# Patient Record
Sex: Female | Born: 1985
Health system: Southern US, Community
[De-identification: ages and names within clinical notes are randomized; demographics above are authoritative.]

## PROBLEM LIST (undated history)

## (undated) DIAGNOSIS — F32A Depression, unspecified: Secondary | ICD-10-CM

## (undated) HISTORY — PX: WISDOM TOOTH EXTRACTION: SHX21

## (undated) HISTORY — DX: Depression, unspecified: F32.A

---

## 2019-11-07 DIAGNOSIS — F411 Generalized anxiety disorder: Secondary | ICD-10-CM | POA: Diagnosis not present

## 2019-11-14 DIAGNOSIS — F411 Generalized anxiety disorder: Secondary | ICD-10-CM | POA: Diagnosis not present

## 2019-11-19 DIAGNOSIS — F411 Generalized anxiety disorder: Secondary | ICD-10-CM | POA: Diagnosis not present

## 2020-01-01 DIAGNOSIS — Z6823 Body mass index (BMI) 23.0-23.9, adult: Secondary | ICD-10-CM | POA: Diagnosis not present

## 2020-01-01 DIAGNOSIS — M26629 Arthralgia of temporomandibular joint, unspecified side: Secondary | ICD-10-CM | POA: Diagnosis not present

## 2020-01-02 DIAGNOSIS — M26629 Arthralgia of temporomandibular joint, unspecified side: Secondary | ICD-10-CM | POA: Diagnosis not present

## 2020-01-11 DIAGNOSIS — Z23 Encounter for immunization: Secondary | ICD-10-CM | POA: Diagnosis not present

## 2020-01-24 DIAGNOSIS — F411 Generalized anxiety disorder: Secondary | ICD-10-CM | POA: Diagnosis not present

## 2020-02-01 DIAGNOSIS — Z23 Encounter for immunization: Secondary | ICD-10-CM | POA: Diagnosis not present

## 2020-03-25 DIAGNOSIS — N943 Premenstrual tension syndrome: Secondary | ICD-10-CM | POA: Diagnosis not present

## 2020-03-25 DIAGNOSIS — R5383 Other fatigue: Secondary | ICD-10-CM | POA: Diagnosis not present

## 2020-05-21 DIAGNOSIS — Z20822 Contact with and (suspected) exposure to covid-19: Secondary | ICD-10-CM | POA: Diagnosis not present

## 2020-05-30 DIAGNOSIS — Z20822 Contact with and (suspected) exposure to covid-19: Secondary | ICD-10-CM | POA: Diagnosis not present

## 2020-06-02 DIAGNOSIS — Z20828 Contact with and (suspected) exposure to other viral communicable diseases: Secondary | ICD-10-CM | POA: Diagnosis not present

## 2020-06-09 DIAGNOSIS — Z113 Encounter for screening for infections with a predominantly sexual mode of transmission: Secondary | ICD-10-CM | POA: Diagnosis not present

## 2020-06-09 DIAGNOSIS — N943 Premenstrual tension syndrome: Secondary | ICD-10-CM | POA: Diagnosis not present

## 2020-06-09 DIAGNOSIS — Z131 Encounter for screening for diabetes mellitus: Secondary | ICD-10-CM | POA: Diagnosis not present

## 2020-06-09 DIAGNOSIS — E161 Other hypoglycemia: Secondary | ICD-10-CM | POA: Diagnosis not present

## 2020-06-09 DIAGNOSIS — Z01419 Encounter for gynecological examination (general) (routine) without abnormal findings: Secondary | ICD-10-CM | POA: Diagnosis not present

## 2020-06-09 DIAGNOSIS — Z124 Encounter for screening for malignant neoplasm of cervix: Secondary | ICD-10-CM | POA: Diagnosis not present

## 2020-06-09 DIAGNOSIS — Z13220 Encounter for screening for lipoid disorders: Secondary | ICD-10-CM | POA: Diagnosis not present

## 2020-06-09 DIAGNOSIS — Z130 Encounter for screening for diseases of the blood and blood-forming organs and certain disorders involving the immune mechanism: Secondary | ICD-10-CM | POA: Diagnosis not present

## 2020-06-09 DIAGNOSIS — Z1329 Encounter for screening for other suspected endocrine disorder: Secondary | ICD-10-CM | POA: Diagnosis not present

## 2020-06-09 LAB — HM PAP SMEAR

## 2020-08-21 DIAGNOSIS — Z713 Dietary counseling and surveillance: Secondary | ICD-10-CM | POA: Diagnosis not present

## 2020-08-26 DIAGNOSIS — Z713 Dietary counseling and surveillance: Secondary | ICD-10-CM | POA: Diagnosis not present

## 2020-09-11 DIAGNOSIS — Z713 Dietary counseling and surveillance: Secondary | ICD-10-CM | POA: Diagnosis not present

## 2020-10-01 DIAGNOSIS — Z713 Dietary counseling and surveillance: Secondary | ICD-10-CM | POA: Diagnosis not present

## 2020-10-30 DIAGNOSIS — Z713 Dietary counseling and surveillance: Secondary | ICD-10-CM | POA: Diagnosis not present

## 2020-11-23 DIAGNOSIS — Z713 Dietary counseling and surveillance: Secondary | ICD-10-CM | POA: Diagnosis not present

## 2020-12-15 DIAGNOSIS — F411 Generalized anxiety disorder: Secondary | ICD-10-CM | POA: Diagnosis not present

## 2020-12-18 DIAGNOSIS — L237 Allergic contact dermatitis due to plants, except food: Secondary | ICD-10-CM | POA: Diagnosis not present

## 2020-12-18 DIAGNOSIS — Z6826 Body mass index (BMI) 26.0-26.9, adult: Secondary | ICD-10-CM | POA: Diagnosis not present

## 2020-12-28 DIAGNOSIS — F411 Generalized anxiety disorder: Secondary | ICD-10-CM | POA: Diagnosis not present

## 2021-01-08 DIAGNOSIS — F411 Generalized anxiety disorder: Secondary | ICD-10-CM | POA: Diagnosis not present

## 2021-01-28 DIAGNOSIS — F411 Generalized anxiety disorder: Secondary | ICD-10-CM | POA: Diagnosis not present

## 2021-02-05 DIAGNOSIS — F411 Generalized anxiety disorder: Secondary | ICD-10-CM | POA: Diagnosis not present

## 2021-02-19 DIAGNOSIS — F411 Generalized anxiety disorder: Secondary | ICD-10-CM | POA: Diagnosis not present

## 2021-03-05 DIAGNOSIS — F411 Generalized anxiety disorder: Secondary | ICD-10-CM | POA: Diagnosis not present

## 2021-04-16 DIAGNOSIS — F411 Generalized anxiety disorder: Secondary | ICD-10-CM | POA: Diagnosis not present

## 2021-05-25 DIAGNOSIS — M7521 Bicipital tendinitis, right shoulder: Secondary | ICD-10-CM | POA: Insufficient documentation

## 2021-05-25 DIAGNOSIS — F411 Generalized anxiety disorder: Secondary | ICD-10-CM | POA: Diagnosis not present

## 2021-05-25 DIAGNOSIS — M25511 Pain in right shoulder: Secondary | ICD-10-CM | POA: Insufficient documentation

## 2021-05-25 DIAGNOSIS — M7591 Shoulder lesion, unspecified, right shoulder: Secondary | ICD-10-CM | POA: Diagnosis not present

## 2021-05-28 DIAGNOSIS — Z20822 Contact with and (suspected) exposure to covid-19: Secondary | ICD-10-CM | POA: Diagnosis not present

## 2021-06-02 DIAGNOSIS — R140 Abdominal distension (gaseous): Secondary | ICD-10-CM | POA: Diagnosis not present

## 2021-06-07 DIAGNOSIS — M7591 Shoulder lesion, unspecified, right shoulder: Secondary | ICD-10-CM | POA: Diagnosis not present

## 2021-06-07 DIAGNOSIS — M25511 Pain in right shoulder: Secondary | ICD-10-CM | POA: Diagnosis not present

## 2021-06-09 DIAGNOSIS — N943 Premenstrual tension syndrome: Secondary | ICD-10-CM | POA: Diagnosis not present

## 2021-06-09 DIAGNOSIS — R140 Abdominal distension (gaseous): Secondary | ICD-10-CM | POA: Diagnosis not present

## 2021-06-09 DIAGNOSIS — Z6825 Body mass index (BMI) 25.0-25.9, adult: Secondary | ICD-10-CM | POA: Diagnosis not present

## 2021-06-09 DIAGNOSIS — Z113 Encounter for screening for infections with a predominantly sexual mode of transmission: Secondary | ICD-10-CM | POA: Diagnosis not present

## 2021-06-10 DIAGNOSIS — R140 Abdominal distension (gaseous): Secondary | ICD-10-CM | POA: Diagnosis not present

## 2021-06-15 DIAGNOSIS — F411 Generalized anxiety disorder: Secondary | ICD-10-CM | POA: Diagnosis not present

## 2021-06-22 DIAGNOSIS — F411 Generalized anxiety disorder: Secondary | ICD-10-CM | POA: Diagnosis not present

## 2021-06-29 ENCOUNTER — Encounter: Payer: Self-pay | Admitting: Internal Medicine

## 2021-06-29 ENCOUNTER — Ambulatory Visit (INDEPENDENT_AMBULATORY_CARE_PROVIDER_SITE_OTHER): Payer: BC Managed Care – PPO | Admitting: Internal Medicine

## 2021-06-29 ENCOUNTER — Other Ambulatory Visit: Payer: Self-pay

## 2021-06-29 VITALS — BP 123/85 | HR 85 | Temp 99.1°F | Ht 67.0 in | Wt 162.6 lb

## 2021-06-29 DIAGNOSIS — Z23 Encounter for immunization: Secondary | ICD-10-CM

## 2021-06-29 DIAGNOSIS — R519 Headache, unspecified: Secondary | ICD-10-CM | POA: Insufficient documentation

## 2021-06-29 DIAGNOSIS — F339 Major depressive disorder, recurrent, unspecified: Secondary | ICD-10-CM

## 2021-06-29 LAB — URINALYSIS, ROUTINE W REFLEX MICROSCOPIC
Bilirubin, UA: NEGATIVE
Ketones, UA: NEGATIVE
Leukocytes,UA: NEGATIVE
Nitrite, UA: NEGATIVE
Protein,UA: NEGATIVE
RBC, UA: NEGATIVE
Specific Gravity, UA: <1.005 — ABNORMAL LOW (ref 1.005–1.030)
Urobilinogen, Ur: 0.2 mg/dL (ref 0.2–1.0)
pH, UA: 6 (ref 5.0–7.5)
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
## 2021-06-30 LAB — COMPREHENSIVE METABOLIC PANEL
ALT: 26 IU/L (ref 0–32)
AST: 20 IU/L (ref 0–40)
Albumin/Globulin Ratio: 1.8 (ref 1.2–2.2)
Albumin: 4.8 g/dL (ref 3.8–4.8)
Alkaline Phosphatase: 63 IU/L (ref 44–121)
BUN/Creatinine Ratio: 18 (ref 9–23)
BUN: 14 mg/dL (ref 6–20)
Bilirubin Total: 0.4 mg/dL (ref 0.0–1.2)
CO2: 24 mmol/L (ref 20–29)
Calcium: 9.9 mg/dL (ref 8.7–10.2)
Chloride: 102 mmol/L (ref 96–106)
Creatinine, Ser: 0.78 mg/dL (ref 0.57–1.00)
Globulin, Total: 2.6 g/dL (ref 1.5–4.5)
Glucose: 100 mg/dL — ABNORMAL HIGH (ref 65–99)
Potassium: 3.9 mmol/L (ref 3.5–5.2)
Sodium: 142 mmol/L (ref 134–144)
Total Protein: 7.4 g/dL (ref 6.0–8.5)
eGFR: 100 mL/min/{1.73_m2} (ref >59)

## 2021-06-30 LAB — TSH: TSH: 1.03 u[IU]/mL (ref 0.450–4.500)

## 2021-07-06 DIAGNOSIS — F411 Generalized anxiety disorder: Secondary | ICD-10-CM | POA: Diagnosis not present

## 2021-07-22 ENCOUNTER — Telehealth: Payer: Self-pay | Admitting: Internal Medicine

## 2021-07-22 NOTE — Telephone Encounter (Signed)
Copied from CRM [id number]. Topic: General - Other >> Jul 22, 2021  1:33 PM Gwenlyn Fudge wrote: Reason for CRM: Pt called stating that she is on day 14 of Covid and is still testing positive for covid. Pt is concerned about her appt tomorrow and is requesting to know protocol. Pt states that she has no symptoms. Attempted to get answer from practice and was advised to leave CRM per [place]. Please advise.

## 2021-07-22 NOTE — Telephone Encounter (Signed)
Yes that is correct. 

## 2021-07-23 ENCOUNTER — Other Ambulatory Visit: Payer: Self-pay

## 2021-07-23 ENCOUNTER — Other Ambulatory Visit: Payer: BC Managed Care – PPO

## 2021-07-23 DIAGNOSIS — R519 Headache, unspecified: Secondary | ICD-10-CM

## 2021-07-23 DIAGNOSIS — Z13220 Encounter for screening for lipoid disorders: Secondary | ICD-10-CM | POA: Diagnosis not present

## 2021-07-23 DIAGNOSIS — F411 Generalized anxiety disorder: Secondary | ICD-10-CM | POA: Diagnosis not present

## 2021-07-23 NOTE — Addendum Note (Signed)
Addended by: Judd Gaudier on: 07/23/2021 08:38 AM   Modules accepted: Orders

## 2021-07-24 LAB — CBC WITH DIFFERENTIAL/PLATELET
Basophils Absolute: 0 10*3/uL (ref 0.0–0.2)
Basos: 1 %
EOS (ABSOLUTE): 0.1 10*3/uL (ref 0.0–0.4)
Eos: 2 %
Hematocrit: 43.9 % (ref 34.0–46.6)
Hemoglobin: 14.7 g/dL (ref 11.1–15.9)
Immature Grans (Abs): 0 10*3/uL (ref 0.0–0.1)
Immature Granulocytes: 0 %
Lymphocytes Absolute: 1.5 10*3/uL (ref 0.7–3.1)
Lymphs: 24 %
MCH: 30.5 pg (ref 26.6–33.0)
MCHC: 33.5 g/dL (ref 31.5–35.7)
MCV: 91 fL (ref 79–97)
Monocytes Absolute: 0.5 10*3/uL (ref 0.1–0.9)
Monocytes: 8 %
Neutrophils Absolute: 4.1 10*3/uL (ref 1.4–7.0)
Neutrophils: 65 %
Platelets: 324 10*3/uL (ref 150–450)
RBC: 4.82 x10E6/uL (ref 3.77–5.28)
RDW: 11.8 % (ref 11.7–15.4)
WBC: 6.2 10*3/uL (ref 3.4–10.8)

## 2021-07-24 LAB — LIPID PANEL
Chol/HDL Ratio: 2.8 ratio (ref 0.0–4.4)
Cholesterol, Total: 181 mg/dL (ref 100–199)
HDL: 64 mg/dL (ref >39)
LDL Chol Calc (NIH): 104 mg/dL — ABNORMAL HIGH (ref 0–99)
Triglycerides: 70 mg/dL (ref 0–149)
VLDL Cholesterol Cal: 13 mg/dL (ref 5–40)

## 2021-08-06 ENCOUNTER — Encounter: Payer: BC Managed Care – PPO | Admitting: Internal Medicine

## 2021-08-12 DIAGNOSIS — F411 Generalized anxiety disorder: Secondary | ICD-10-CM | POA: Diagnosis not present

## 2021-08-24 ENCOUNTER — Ambulatory Visit (INDEPENDENT_AMBULATORY_CARE_PROVIDER_SITE_OTHER): Payer: BC Managed Care – PPO | Admitting: Internal Medicine

## 2021-08-24 ENCOUNTER — Other Ambulatory Visit: Payer: Self-pay

## 2021-08-24 ENCOUNTER — Encounter: Payer: Self-pay | Admitting: Internal Medicine

## 2021-08-24 VITALS — BP 122/88 | HR 71 | Temp 98.5°F | Ht 67.01 in | Wt 165.4 lb

## 2021-08-24 DIAGNOSIS — Z77090 Contact with and (suspected) exposure to asbestos: Secondary | ICD-10-CM

## 2021-08-24 DIAGNOSIS — R109 Unspecified abdominal pain: Secondary | ICD-10-CM | POA: Diagnosis not present

## 2021-08-24 DIAGNOSIS — R81 Glycosuria: Secondary | ICD-10-CM

## 2021-08-24 DIAGNOSIS — R140 Abdominal distension (gaseous): Secondary | ICD-10-CM

## 2021-08-24 DIAGNOSIS — Z0000 Encounter for general adult medical examination without abnormal findings: Secondary | ICD-10-CM

## 2021-08-24 LAB — URINALYSIS, ROUTINE W REFLEX MICROSCOPIC
Bilirubin, UA: NEGATIVE
Glucose, UA: NEGATIVE
Ketones, UA: NEGATIVE
Leukocytes,UA: NEGATIVE
Nitrite, UA: NEGATIVE
Protein,UA: NEGATIVE
RBC, UA: NEGATIVE
Specific Gravity, UA: 1.01 (ref 1.005–1.030)
Urobilinogen, Ur: 0.2 mg/dL (ref 0.2–1.0)
pH, UA: 7 (ref 5.0–7.5)

## 2021-08-24 LAB — BAYER DCA HB A1C WAIVED: HB A1C (BAYER DCA - WAIVED): 4.8 % (ref 4.8–5.6)

## 2021-08-24 MED ORDER — FEXOFENADINE HCL 180 MG PO TABS: 180.0000 mg | ORAL_TABLET | Freq: Every day | ORAL | 1 refills | Status: DC
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
## 2021-08-27 DIAGNOSIS — F411 Generalized anxiety disorder: Secondary | ICD-10-CM | POA: Diagnosis not present

## 2021-09-14 ENCOUNTER — Encounter: Payer: Self-pay | Admitting: Nurse Practitioner

## 2021-09-14 ENCOUNTER — Telehealth (INDEPENDENT_AMBULATORY_CARE_PROVIDER_SITE_OTHER): Payer: BC Managed Care – PPO | Admitting: Nurse Practitioner

## 2021-09-14 DIAGNOSIS — U071 COVID-19: Secondary | ICD-10-CM | POA: Insufficient documentation

## 2021-09-14 NOTE — Progress Notes (Signed)
Acute Office Visit  Subjective:    Patient ID: Laura Russo, female    DOB: 02-22-1986, 35 y.o.   MRN: [id number]  Chief Complaint  Patient presents with   Covid Positive    Tested positive on last Friday. Symptoms are just a small amount of sniffles. Weird  to her that she had gotten Covid 4 times in a row. She has tested neg in between all of her home tests.     HPI Patient is in today for positive home covid-19 test on Dec 2. She states she was going into work on 09/11/21 for a meeting and had a runny nose and the test was positive. She also had a positive test on 09/11/21. She has had 3 prior positive covid-19 tests and symptoms since August 2022 with significant exposures before the first 2. Her last covid-19 infection was 08/13/21 and she has had on and off symptoms since then. She sent a record of her covid-19 test results and symptoms in a mychart message which was reviewed. She states that all of her symptoms are resolving and she had some post nasal drip this morning. She is concerned that this is her 4th positive covid-19 infection in 4 months and is asking about a covid-19 booster.    Past Medical History:  Diagnosis Date   Depression     Past Surgical History:  Procedure Laterality Date   WISDOM TOOTH EXTRACTION      Family History  Problem Relation Age of Onset   Depression Mother    Asthma Mother    Heart disease Father    Diverticulosis Father    Heart disease Paternal Uncle    Heart disease Maternal Grandmother    Dementia Maternal Grandmother    Dementia Maternal Grandfather    Colon cancer Paternal Grandmother     Social History   Socioeconomic History   Marital status: Soil scientist    Spouse name: Not on file   Number of children: Not on file   Years of education: Not on file   Highest education level: Not on file  Occupational History   Not on file  Tobacco Use   Smoking status: Never   Smokeless tobacco: Never  Vaping Use   Vaping  Use: Never used  Substance and Sexual Activity   Alcohol use: Yes    Comment: on occasion   Drug use: Never   Sexual activity: Yes  Other Topics Concern   Not on file  Social History Narrative   Not on file   Social Determinants of Health   Financial Resource Strain: Not on file  Food Insecurity: Not on file  Transportation Needs: Not on file  Physical Activity: Not on file  Stress: Not on file  Social Connections: Not on file  Intimate Partner Violence: Not on file    Outpatient Medications Prior to Visit  Medication Sig Dispense Refill   fluticasone (FLONASE) 50 MCG/ACT nasal spray      LO LOESTRIN FE 1 MG-10 MCG / 10 MCG tablet Take 1 tablet by mouth daily.     Loratadine 10 MG CAPS Take 1 capsule by mouth daily as needed.     fexofenadine (ALLEGRA ALLERGY) 180 MG tablet Take 1 tablet (180 mg total) by mouth daily. 10 tablet 1   No facility-administered medications prior to visit.    Allergies  Allergen Reactions   Erythromycin Base Rash    Review of Systems  Constitutional: Negative.   HENT:  Positive for postnasal  drip and rhinorrhea (resolving). Negative for sore throat.   Eyes: Negative.   Respiratory: Negative.    Cardiovascular: Negative.   Gastrointestinal: Negative.   Endocrine: Negative.   Genitourinary: Negative.   Musculoskeletal: Negative.   Skin: Negative.   Neurological: Negative.   Psychiatric/Behavioral: Negative.        Objective:    Physical Exam Vitals and nursing note reviewed.  Constitutional:      General: She is not in acute distress.    Appearance: Normal appearance.  HENT:     Head: Normocephalic.  Eyes:     Conjunctiva/sclera: Conjunctivae normal.  Pulmonary:     Effort: Pulmonary effort is normal.     Comments: Able to talk in complete sentences Neurological:     Mental Status: She is alert and oriented to person, place, and time.  Psychiatric:        Mood and Affect: Mood normal.        Behavior: Behavior normal.         Thought Content: Thought content normal.        Judgment: Judgment normal.    There were no vitals taken for this visit. Wt Readings from Last 3 Encounters:  08/24/21 165 lb 6.4 oz (75 kg)  06/29/21 162 lb 9.6 oz (73.8 kg)    Health Maintenance Due  Topic Date Due   HIV Screening  Never done   Hepatitis C Screening  Never done   PAP SMEAR-Modifier  Never done   COVID-19 Vaccine (4 - Booster for Pfizer series) 10/21/2020    There are no preventive care reminders to display for this patient.   Lab Results  Component Value Date   TSH 1.030 06/29/2021   Lab Results  Component Value Date   WBC 6.2 07/23/2021   HGB 14.7 07/23/2021   HCT 43.9 07/23/2021   MCV 91 07/23/2021   PLT 324 07/23/2021   Lab Results  Component Value Date   NA 142 06/29/2021   K 3.9 06/29/2021   CO2 24 06/29/2021   GLUCOSE 100 (H) 06/29/2021   BUN 14 06/29/2021   CREATININE 0.78 06/29/2021   BILITOT 0.4 06/29/2021   ALKPHOS 63 06/29/2021   AST 20 06/29/2021   ALT 26 06/29/2021   PROT 7.4 06/29/2021   ALBUMIN 4.8 06/29/2021   CALCIUM 9.9 06/29/2021   EGFR 100 06/29/2021   Lab Results  Component Value Date   CHOL 181 07/23/2021   Lab Results  Component Value Date   HDL 64 07/23/2021   Lab Results  Component Value Date   LDLCALC 104 (H) 07/23/2021   Lab Results  Component Value Date   TRIG 70 07/23/2021   Lab Results  Component Value Date   CHOLHDL 2.8 07/23/2021   Lab Results  Component Value Date   HGBA1C 4.8 08/24/2021       Assessment & Plan:   Problem List Items Addressed This Visit       Other   COVID-19 - Primary    She has had 4 positive covid-19 tests in the last 4 months with the most recent being 09/10/21. After reviewing her chart, she did have intermittent symptoms since her last covid-19 infection 08/13/21. This could represent ongoing infection and not a new infection. She did have a negative test yesterday. Discussed that these positive tests and  symptoms could be from different strains of covid. Discussed recommendations of covid-19 booster with recent infection. She can wait 3 months or she can get it sooner,  with multiple recent infections, she may want to get it sooner. Encouraged her to reach out if she has another positive covid-19 test with symptoms.         No orders of the defined types were placed in this encounter.   This visit was completed via MyChart due to the restrictions of the COVID-19 pandemic. All issues as above were discussed and addressed. Physical exam was done as above through visual confirmation on MyChart. If it was felt that the patient should be evaluated in the office, they were directed there. The patient verbally consented to this visit. Location of the patient: home Location of the provider: work Those involved with this call:  Provider: Vance Peper, NP CMA: Frazier Butt, [place] Desk/Registration: Myrlene Broker  Time spent on call:  15 minutes with patient face to face via video conference. More than 50% of this time was spent in counseling and coordination of care. 5 minutes total spent in review of patient's record and preparation of their chart.  Charyl Dancer, NP

## 2021-09-14 NOTE — Assessment & Plan Note (Signed)
She has had 4 positive covid-19 tests in the last 4 months with the most recent being 09/10/21. After reviewing her chart, she did have intermittent symptoms since her last covid-19 infection 08/13/21. This could represent ongoing infection and not a new infection. She did have a negative test yesterday. Discussed that these positive tests and symptoms could be from different strains of covid. Discussed recommendations of covid-19 booster with recent infection. She can wait 3 months or she can get it sooner, with multiple recent infections, she may want to get it sooner. Encouraged her to reach out if she has another positive covid-19 test with symptoms.

## 2021-09-15 DIAGNOSIS — F411 Generalized anxiety disorder: Secondary | ICD-10-CM | POA: Diagnosis not present

## 2021-09-22 DIAGNOSIS — F411 Generalized anxiety disorder: Secondary | ICD-10-CM | POA: Diagnosis not present

## 2021-10-11 DIAGNOSIS — F411 Generalized anxiety disorder: Secondary | ICD-10-CM | POA: Diagnosis not present

## 2021-10-21 ENCOUNTER — Ambulatory Visit: Payer: BC Managed Care – PPO | Admitting: Gastroenterology

## 2021-10-26 ENCOUNTER — Institutional Professional Consult (permissible substitution): Payer: BC Managed Care – PPO | Admitting: Pulmonary Disease

## 2021-11-03 DIAGNOSIS — F411 Generalized anxiety disorder: Secondary | ICD-10-CM | POA: Diagnosis not present

## 2021-11-04 ENCOUNTER — Ambulatory Visit: Payer: Self-pay

## 2021-11-04 DIAGNOSIS — Z20822 Contact with and (suspected) exposure to covid-19: Secondary | ICD-10-CM | POA: Diagnosis not present

## 2021-11-04 NOTE — Telephone Encounter (Signed)
°  Chief Complaint: advice Symptoms: none Frequency: NA Pertinent Negatives: NA Disposition: [] ED /[] Urgent Care (no appt availability in office) / [] Appointment(In office/virtual)/ []  [facility]/ [x] Home Care/ [] Refused Recommended Disposition /[] [facility]/ []  Follow-up with PCP Additional Notes: Pt was calling to ask about COVID, she did a home test this morning that was faintly positive and went to [facility] to get rapid PCR and it was negative. Pt just wanted advice on what to do about trip to [place] next week.   Reason for Disposition  Health Information question, no triage required and triager able to answer question  Protocols used: Information Only Call - No Triage-A-AH

## 2021-11-05 ENCOUNTER — Institutional Professional Consult (permissible substitution): Payer: BC Managed Care – PPO | Admitting: Internal Medicine

## 2021-11-06 DIAGNOSIS — Z20822 Contact with and (suspected) exposure to covid-19: Secondary | ICD-10-CM | POA: Diagnosis not present

## 2021-11-18 DIAGNOSIS — Z113 Encounter for screening for infections with a predominantly sexual mode of transmission: Secondary | ICD-10-CM | POA: Diagnosis not present

## 2021-11-18 DIAGNOSIS — Z01419 Encounter for gynecological examination (general) (routine) without abnormal findings: Secondary | ICD-10-CM | POA: Diagnosis not present

## 2021-12-03 DIAGNOSIS — F411 Generalized anxiety disorder: Secondary | ICD-10-CM | POA: Diagnosis not present

## 2021-12-06 ENCOUNTER — Encounter: Payer: Self-pay | Admitting: Gastroenterology

## 2021-12-06 ENCOUNTER — Ambulatory Visit (INDEPENDENT_AMBULATORY_CARE_PROVIDER_SITE_OTHER): Payer: BC Managed Care – PPO | Admitting: Gastroenterology

## 2021-12-06 ENCOUNTER — Other Ambulatory Visit: Payer: Self-pay

## 2021-12-06 VITALS — BP 135/94 | HR 76 | Temp 98.9°F | Ht 67.01 in | Wt 163.0 lb

## 2021-12-06 DIAGNOSIS — R140 Abdominal distension (gaseous): Secondary | ICD-10-CM

## 2021-12-06 NOTE — Progress Notes (Signed)
Arlyss Repress, MD [address]  [address]  [place], [place] [address]  Main: [phone]  Fax: [phone]    Gastroenterology Consultation  Referring Provider:     Loura Pardon, MD Primary Care Physician:  Loura Pardon, MD Primary Gastroenterologist:  Dr. Arlyss Repress Reason for Consultation:     Abdominal bloating        HPI:   Laura Russo is a 36 y.o. female referred by Dr. Loura Pardon, MD  for consultation & management of abdominal bloating.  Patient reports approximately 1 year history of abdominal bloating with no particular relation to food.  She underwent hydrogen breath test which was negative.  She denies any nausea, vomiting, change in bowel habits.  She reports significant stress in her life.  She works for an [employer], [employer].  She denies any epigastric pain.  Her labs including CBC, CMP, lipid profile, TSH were unremarkable.  She denies consumption of carbonated beverages, sweet tea etc.   She does not smoke or drink alcohol  NSAIDs: None  Antiplts/Anticoagulants/Anti thrombotics: None  GI Procedures: None  Past Medical History:  Diagnosis Date   Depression     Past Surgical History:  Procedure Laterality Date   WISDOM TOOTH EXTRACTION      Current Outpatient Medications:    fluticasone (FLONASE) 50 MCG/ACT nasal spray, , Disp: , Rfl:    LO LOESTRIN FE 1 MG-10 MCG / 10 MCG tablet, Take 1 tablet by mouth daily., Disp: , Rfl:    Loratadine 10 MG CAPS, Take 1 capsule by mouth daily as needed., Disp: , Rfl:    Family History  Problem Relation Age of Onset   Depression Mother    Asthma Mother    Heart disease Father    Diverticulosis Father    Heart disease Paternal Uncle    Heart disease Maternal Grandmother    Dementia Maternal Grandmother    Dementia Maternal Grandfather    Colon cancer Paternal Grandmother      Social History   Tobacco Use   Smoking status: Never   Smokeless tobacco: Never  Vaping Use   Vaping Use:  Never used  Substance Use Topics   Alcohol use: Yes    Comment: on occasion   Drug use: Never    Allergies as of 12/06/2021 - Review Complete 12/06/2021  Allergen Reaction Noted   Erythromycin base Rash 06/02/2021    Review of Systems:    All systems reviewed and negative except where noted in HPI.   Physical Exam:  BP (!) 135/94 (BP Location: Left Arm, Patient Position: Sitting, Cuff Size: Normal)    Pulse 76    Temp 98.9 F (37.2 C) (Oral)    Ht 5' 7.01" (1.702 m)    Wt 163 lb (73.9 kg)    BMI 25.52 kg/m  No LMP recorded.  General:   Alert,  Well-developed, well-nourished, pleasant and cooperative in NAD Head:  Normocephalic and atraumatic. Eyes:  Sclera clear, no icterus.   Conjunctiva pink. Ears:  Normal auditory acuity. Nose:  No deformity, discharge, or lesions. Mouth:  No deformity or lesions,oropharynx pink & moist. Neck:  Supple; no masses or thyromegaly. Lungs:  Respirations even and unlabored.  Clear throughout to auscultation.   No wheezes, crackles, or rhonchi. No acute distress. Heart:  Regular rate and rhythm; no murmurs, clicks, rubs, or gallops. Abdomen:  Normal bowel sounds. Soft, non-tender and mildly distended without masses, hepatosplenomegaly or hernias noted.  No guarding or rebound tenderness.  Rectal: Not performed Msk:  Symmetrical without gross deformities. Good, equal movement & strength bilaterally. Pulses:  Normal pulses noted. Extremities:  No clubbing or edema.  No cyanosis. Neurologic:  Alert and oriented x3;  grossly normal neurologically. Skin:  Intact without significant lesions or rashes. No jaundice. Lymph Nodes:  No significant cervical adenopathy. Psych:  Alert and cooperative. Normal mood and affect.  Imaging Studies: None  Assessment and Plan:   Laura Russo is a 36 y.o. female with no significant past medical history is seen in consultation for chronic abdominal bloating.  Hydrogen breath test was negative.  Patient  does not have any red flags.  Recommend H. pylori breath test and treat if positive.  Celiac disease panel was negative.  Discussed with patient that upper endoscopy will be of low yield if H. pylori breath test is negative.  Discussed about food allergy profile, alpha gal panel if H. pylori breath test is negative.  Also, we can consider empiric trial of rifaximin for possible bacterial overgrowth.  Gave her samples of FD guard because patient reported that peppermint tea provides some relief.   Follow up in 2 to 3 months   Arlyss Repress, MD

## 2021-12-06 NOTE — Patient Instructions (Addendum)
Gave samples of Fd guard and restora.

## 2021-12-07 ENCOUNTER — Encounter: Payer: Self-pay | Admitting: Gastroenterology

## 2021-12-07 LAB — H. PYLORI BREATH TEST: H pylori Breath Test: NEGATIVE

## 2021-12-08 ENCOUNTER — Encounter: Payer: Self-pay | Admitting: Gastroenterology

## 2021-12-08 DIAGNOSIS — M542 Cervicalgia: Secondary | ICD-10-CM | POA: Diagnosis not present

## 2021-12-08 DIAGNOSIS — M25511 Pain in right shoulder: Secondary | ICD-10-CM | POA: Diagnosis not present

## 2021-12-08 DIAGNOSIS — R293 Abnormal posture: Secondary | ICD-10-CM | POA: Diagnosis not present

## 2021-12-08 DIAGNOSIS — R6884 Jaw pain: Secondary | ICD-10-CM | POA: Diagnosis not present

## 2021-12-09 ENCOUNTER — Encounter: Payer: Self-pay | Admitting: Internal Medicine

## 2021-12-09 ENCOUNTER — Ambulatory Visit
Admission: RE | Admit: 2021-12-09 | Discharge: 2021-12-09 | Disposition: A | Discharge status: Home / Self Care | Payer: BC Managed Care – PPO | Attending: Internal Medicine | Admitting: Internal Medicine

## 2021-12-09 ENCOUNTER — Ambulatory Visit
Admission: RE | Admit: 2021-12-09 | Discharge: 2021-12-09 | Disposition: A | Discharge status: Home / Self Care | Payer: BC Managed Care – PPO | Source: Ambulatory Visit | Attending: Internal Medicine | Admitting: Internal Medicine

## 2021-12-09 ENCOUNTER — Other Ambulatory Visit: Payer: Self-pay

## 2021-12-09 ENCOUNTER — Ambulatory Visit (INDEPENDENT_AMBULATORY_CARE_PROVIDER_SITE_OTHER): Payer: BC Managed Care – PPO | Admitting: Internal Medicine

## 2021-12-09 DIAGNOSIS — Z77090 Contact with and (suspected) exposure to asbestos: Secondary | ICD-10-CM | POA: Diagnosis not present

## 2021-12-09 NOTE — Patient Instructions (Addendum)
Marylu Lund Ward [employer] good sort  ? ?Please remember to go to the  x-ray department  for your tests - we will call you with the results when they are available   ?

## 2021-12-09 NOTE — Progress Notes (Signed)
? ?Brylynn Hanssen, female    DOB: 1986-09-19   MRN: [id number] ? ? ?Brief patient profile:  ?35   yowf  never smoker ? EIA HS/college then stopped pushing herself so hard and no sob sincee  referred to pulmonary clinic in [facility]  12/09/2021 by Dr Charlotta Newton re asbestos exp in 2013 x 3 days when helped scraped popcorn ceiling off her appt and later found out it contained asbestos. 2 oher contacts unclear whether they've been affected because she has lost touch with them and wants assessment for risk of smoking  ? ? ? ? ?History of Present Illness  ?12/09/2021  Pulmonary/ 1st office eval/ Sherene Sires / [employer]  Office  ?Chief Complaint  ?Patient presents with  ? pulmonary consult  ?  Hx of asthma. No current sx.  ?  ?Dyspnea:  [employer] / occ runs but really not much aerobic ?Cough: none  ?Sleep: no resp cc  x snoring  ?SABA use: none  ? ?No obvious day to day or daytime variability or assoc excess/ purulent sputum or mucus plugs or hemoptysis or cp or chest tightness, subjective wheeze or overt sinus or hb symptoms.  ? ?Sleeping  without nocturnal  or early am exacerbation  of respiratory  c/o's or need for noct saba. Also denies any obvious fluctuation of symptoms with weather or environmental changes or other aggravating or alleviating factors except as outlined above  ? ?No unusual exposure hx or h/o childhood pna/ asthma or knowledge of premature birth. ? ?Current Allergies, Complete Past Medical History, Past Surgical History, Family History, and Social History were reviewed in [employer] record. ? ?ROS  The following are not active complaints unless bolded ?Hoarseness, sore throat, dysphagia, dental problems, itching, sneezing,  nasal congestion or discharge of excess mucus or purulent secretions, ear ache,   fever, chills, sweats, unintended wt loss or wt gain, classically pleuritic or exertional cp,  orthopnea pnd or arm/hand swelling  or leg swelling, presyncope, palpitations,  abdominal pain, anorexia, nausea, vomiting, diarrhea  or change in bowel habits or change in bladder habits, change in stools or change in urine, dysuria, hematuria,  rash, arthralgias, visual complaints, headache, numbness, weakness or ataxia or problems with walking or coordination,  change in mood or  memory. ?      ?   ? ?Past Medical History:  ?Diagnosis Date  ? Depression   ? ? ?Outpatient Medications Prior to Visit  ?Medication Sig Dispense Refill  ? fluticasone (FLONASE) 50 MCG/ACT nasal spray     ? ibuprofen (ADVIL) 200 MG tablet Take 200 mg by mouth every 6 (six) hours as needed.    ? LO LOESTRIN FE 1 MG-10 MCG / 10 MCG tablet Take 1 tablet by mouth daily.    ? Loratadine 10 MG CAPS Take 1 capsule by mouth daily as needed.    ? ?No facility-administered medications prior to visit.  ? ? ? ?Objective:  ?  ? ?BP 110/70 (BP Location: Left Arm, Cuff Size: Normal)   Pulse 78   Temp 97.9 ?F (36.6 ?C) (Temporal)   Ht 5\' 6"  (1.676 m)   Wt 161 lb 3.2 oz (73.1 kg)   SpO2 98%   BMI 26.02 kg/m?  ? ?SpO2: 98 % ? ?Amb wf nad ? ? HEENT : pt wearing mask not removed for exam due to covid -19 concerns.  ? ? ?NECK :  without JVD/Nodes/TM/ nl carotid upstrokes bilaterally ? ? ?LUNGS: no acc muscle use,  Nl  contour chest which is clear to A and P bilaterally without cough on insp or exp maneuvers ? ? ?CV:  RRR  no s3 or murmur or increase in P2, and no edema  ? ?ABD:  soft and nontender with nl inspiratory excursion in the supine position. No bruits or organomegaly appreciated, bowel sounds nl ? ?MS:  Nl gait/ ext warm without deformities, calf tenderness, cyanosis or clubbing ?No obvious joint restrictions  ? ?SKIN: warm and dry without lesions   ? ?NEURO:  alert, approp, nl sensorium with  no motor or cerebellar deficits apparent. ? ? ?CXR PA and Lateral:   12/09/2021 :    ?I personally reviewed images and impression is as follows:     ?Wnl - no evidence of pleural plaques or ILD  ? ?   ?Assessment  ? ?Asbestos  exposure ?2013 x 3 days rented appt, never smoker with nl cxr 12/09/2021  ? ?Her risk is low for complications of acute asbestos exp esp since it was short lived and she was not nor ever has been a smoker.  ? ?I explained the main ramifications in this setting are related to medical- legal concerns, not "preventive" rx of any kind as whatever she breathed in as bound to stay in. ? ?Did advise to call the office of an attorney who specializes in asbestos cases so she can get the info she needs if she does develop symptoms or radiographic indication of either ILD or pleural dz and for this purpose I would repeat cxr in 2y, sooner if new sob, cough - CT chest only if sound medical or legal indication to to so.   ? ?Discussed in detail all the  indications, usual  risks and alternatives  relative to the benefits with patient who agrees to proceed with conservative f/u as outlined   ? ?    ?  ? ?Each maintenance medication was reviewed in detail including emphasizing most importantly the difference between maintenance and prns and under what circumstances the prns are to be triggered using an action plan format where appropriate. ? ?Total time for H and P, chart review, counseling,  and generating customized AVS unique to this office visit / same day charting  > 30 min  ?     ? ? ? ? ?Sandrea Hughs, MD ?12/09/2021 ?    ?

## 2021-12-10 ENCOUNTER — Encounter: Payer: Self-pay | Admitting: Internal Medicine

## 2021-12-10 NOTE — Assessment & Plan Note (Addendum)
2013 x 3 days rented appt, never smoker with nl cxr 12/09/2021  ? ?Her risk is low for complications of acute asbestos exp esp since it was short lived and she was not nor ever has been a smoker.  ? ?I explained the main ramifications in this setting are related to medical- legal concerns, not "preventive" rx of any kind as whatever she breathed in as bound to stay in. ? ?Did advise to call the office of an attorney who specializes in asbestos cases so she can get the info she needs if she does develop symptoms or radiographic indication of either ILD or pleural dz and for this purpose I would repeat cxr in 2y, sooner if new sob, cough - CT chest only if sound medical or legal indication to to so.   ? ?Discussed in detail all the  indications, usual  risks and alternatives  relative to the benefits with patient who agrees to proceed with conservative f/u as outlined   ? ?    ?  ? ?Each maintenance medication was reviewed in detail including emphasizing most importantly the difference between maintenance and prns and under what circumstances the prns are to be triggered using an action plan format where appropriate. ? ?Total time for H and P, chart review, counseling,  and generating customized AVS unique to this office visit / same day charting  > 30 min  ?     ?

## 2021-12-16 DIAGNOSIS — F411 Generalized anxiety disorder: Secondary | ICD-10-CM | POA: Diagnosis not present

## 2021-12-17 DIAGNOSIS — M542 Cervicalgia: Secondary | ICD-10-CM | POA: Diagnosis not present

## 2021-12-17 DIAGNOSIS — M25511 Pain in right shoulder: Secondary | ICD-10-CM | POA: Diagnosis not present

## 2021-12-17 DIAGNOSIS — R6884 Jaw pain: Secondary | ICD-10-CM | POA: Diagnosis not present

## 2021-12-17 DIAGNOSIS — R293 Abnormal posture: Secondary | ICD-10-CM | POA: Diagnosis not present

## 2021-12-22 DIAGNOSIS — F411 Generalized anxiety disorder: Secondary | ICD-10-CM | POA: Diagnosis not present

## 2021-12-24 DIAGNOSIS — R6884 Jaw pain: Secondary | ICD-10-CM | POA: Diagnosis not present

## 2021-12-24 DIAGNOSIS — R293 Abnormal posture: Secondary | ICD-10-CM | POA: Diagnosis not present

## 2021-12-24 DIAGNOSIS — M25511 Pain in right shoulder: Secondary | ICD-10-CM | POA: Diagnosis not present

## 2021-12-24 DIAGNOSIS — M542 Cervicalgia: Secondary | ICD-10-CM | POA: Diagnosis not present

## 2022-01-06 DIAGNOSIS — F411 Generalized anxiety disorder: Secondary | ICD-10-CM | POA: Diagnosis not present

## 2022-01-07 DIAGNOSIS — M25511 Pain in right shoulder: Secondary | ICD-10-CM | POA: Diagnosis not present

## 2022-01-07 DIAGNOSIS — M542 Cervicalgia: Secondary | ICD-10-CM | POA: Diagnosis not present

## 2022-01-07 DIAGNOSIS — R293 Abnormal posture: Secondary | ICD-10-CM | POA: Diagnosis not present

## 2022-01-07 DIAGNOSIS — R6884 Jaw pain: Secondary | ICD-10-CM | POA: Diagnosis not present

## 2022-01-17 ENCOUNTER — Telehealth: Payer: BC Managed Care – PPO | Admitting: Gastroenterology

## 2022-01-18 DIAGNOSIS — M542 Cervicalgia: Secondary | ICD-10-CM | POA: Diagnosis not present

## 2022-01-18 DIAGNOSIS — R6884 Jaw pain: Secondary | ICD-10-CM | POA: Diagnosis not present

## 2022-01-18 DIAGNOSIS — M25511 Pain in right shoulder: Secondary | ICD-10-CM | POA: Diagnosis not present

## 2022-01-18 DIAGNOSIS — R293 Abnormal posture: Secondary | ICD-10-CM | POA: Diagnosis not present

## 2022-01-26 DIAGNOSIS — F411 Generalized anxiety disorder: Secondary | ICD-10-CM | POA: Diagnosis not present

## 2022-02-04 DIAGNOSIS — M542 Cervicalgia: Secondary | ICD-10-CM | POA: Diagnosis not present

## 2022-02-04 DIAGNOSIS — M25511 Pain in right shoulder: Secondary | ICD-10-CM | POA: Diagnosis not present

## 2022-02-04 DIAGNOSIS — R6884 Jaw pain: Secondary | ICD-10-CM | POA: Diagnosis not present

## 2022-02-04 DIAGNOSIS — R293 Abnormal posture: Secondary | ICD-10-CM | POA: Diagnosis not present

## 2022-02-10 DIAGNOSIS — R293 Abnormal posture: Secondary | ICD-10-CM | POA: Diagnosis not present

## 2022-02-10 DIAGNOSIS — M25511 Pain in right shoulder: Secondary | ICD-10-CM | POA: Diagnosis not present

## 2022-02-10 DIAGNOSIS — R6884 Jaw pain: Secondary | ICD-10-CM | POA: Diagnosis not present

## 2022-02-10 DIAGNOSIS — M542 Cervicalgia: Secondary | ICD-10-CM | POA: Diagnosis not present

## 2022-02-14 DIAGNOSIS — R293 Abnormal posture: Secondary | ICD-10-CM | POA: Diagnosis not present

## 2022-02-14 DIAGNOSIS — M542 Cervicalgia: Secondary | ICD-10-CM | POA: Diagnosis not present

## 2022-02-14 DIAGNOSIS — R6884 Jaw pain: Secondary | ICD-10-CM | POA: Diagnosis not present

## 2022-02-14 DIAGNOSIS — M25511 Pain in right shoulder: Secondary | ICD-10-CM | POA: Diagnosis not present

## 2022-02-21 DIAGNOSIS — F411 Generalized anxiety disorder: Secondary | ICD-10-CM | POA: Diagnosis not present

## 2022-02-22 DIAGNOSIS — M542 Cervicalgia: Secondary | ICD-10-CM | POA: Diagnosis not present

## 2022-02-22 DIAGNOSIS — R293 Abnormal posture: Secondary | ICD-10-CM | POA: Diagnosis not present

## 2022-02-22 DIAGNOSIS — R6884 Jaw pain: Secondary | ICD-10-CM | POA: Diagnosis not present

## 2022-02-22 DIAGNOSIS — M25511 Pain in right shoulder: Secondary | ICD-10-CM | POA: Diagnosis not present

## 2022-03-03 DIAGNOSIS — R6884 Jaw pain: Secondary | ICD-10-CM | POA: Diagnosis not present

## 2022-03-03 DIAGNOSIS — R293 Abnormal posture: Secondary | ICD-10-CM | POA: Diagnosis not present

## 2022-03-03 DIAGNOSIS — M25511 Pain in right shoulder: Secondary | ICD-10-CM | POA: Diagnosis not present

## 2022-03-03 DIAGNOSIS — M542 Cervicalgia: Secondary | ICD-10-CM | POA: Diagnosis not present

## 2022-03-11 DIAGNOSIS — M542 Cervicalgia: Secondary | ICD-10-CM | POA: Diagnosis not present

## 2022-03-11 DIAGNOSIS — R6884 Jaw pain: Secondary | ICD-10-CM | POA: Diagnosis not present

## 2022-03-11 DIAGNOSIS — M25511 Pain in right shoulder: Secondary | ICD-10-CM | POA: Diagnosis not present

## 2022-03-11 DIAGNOSIS — R293 Abnormal posture: Secondary | ICD-10-CM | POA: Diagnosis not present

## 2022-03-17 DIAGNOSIS — F411 Generalized anxiety disorder: Secondary | ICD-10-CM | POA: Diagnosis not present

## 2022-03-18 DIAGNOSIS — R293 Abnormal posture: Secondary | ICD-10-CM | POA: Diagnosis not present

## 2022-03-18 DIAGNOSIS — R6884 Jaw pain: Secondary | ICD-10-CM | POA: Diagnosis not present

## 2022-03-18 DIAGNOSIS — M542 Cervicalgia: Secondary | ICD-10-CM | POA: Diagnosis not present

## 2022-03-18 DIAGNOSIS — M25511 Pain in right shoulder: Secondary | ICD-10-CM | POA: Diagnosis not present

## 2022-04-14 DIAGNOSIS — F411 Generalized anxiety disorder: Secondary | ICD-10-CM | POA: Diagnosis not present

## 2022-04-27 DIAGNOSIS — F411 Generalized anxiety disorder: Secondary | ICD-10-CM | POA: Diagnosis not present

## 2022-05-04 DIAGNOSIS — F411 Generalized anxiety disorder: Secondary | ICD-10-CM | POA: Diagnosis not present

## 2022-05-23 DIAGNOSIS — F411 Generalized anxiety disorder: Secondary | ICD-10-CM | POA: Diagnosis not present

## 2022-06-15 DIAGNOSIS — F411 Generalized anxiety disorder: Secondary | ICD-10-CM | POA: Diagnosis not present

## 2022-06-24 ENCOUNTER — Encounter: Payer: Self-pay | Admitting: Physician Assistant

## 2022-06-24 ENCOUNTER — Ambulatory Visit (INDEPENDENT_AMBULATORY_CARE_PROVIDER_SITE_OTHER): Payer: BC Managed Care – PPO | Admitting: Physician Assistant

## 2022-06-24 ENCOUNTER — Ambulatory Visit: Payer: Self-pay

## 2022-06-24 VITALS — BP 124/88 | HR 83 | Temp 98.7°F | Ht 65.98 in | Wt 160.5 lb

## 2022-06-24 DIAGNOSIS — W57XXXA Bitten or stung by nonvenomous insect and other nonvenomous arthropods, initial encounter: Secondary | ICD-10-CM | POA: Insufficient documentation

## 2022-06-24 DIAGNOSIS — W57XXXS Bitten or stung by nonvenomous insect and other nonvenomous arthropods, sequela: Secondary | ICD-10-CM | POA: Diagnosis not present

## 2022-06-24 DIAGNOSIS — R5383 Other fatigue: Secondary | ICD-10-CM | POA: Diagnosis not present

## 2022-06-24 LAB — VERITOR FLU A/B WAIVED
Influenza A: NEGATIVE
Influenza B: NEGATIVE

## 2022-06-24 NOTE — Telephone Encounter (Signed)
Pt states she has been experiencing fatigue, headache, dizziness, and nausea   Pt requesting an appt for today, there is no availability   Please fu w/ pt      Chief Complaint: Fatigue,headache,dizziness, nausea Symptoms: Above Frequency: Last Monday Pertinent Negatives: Patient denies fever Disposition: [] ED /[] Urgent Care (no appt availability in office) / [] Appointment(In office/virtual)/ [x]  [facility]/ [] Home Care/ [] Refused Recommended Disposition /[] [facility]/ []  Follow-up with PCP Additional Notes:   Reason for Disposition  [1] MODERATE weakness (i.e., interferes with work, school, normal activities) AND [2] cause unknown  (Exceptions: Weakness from acute minor illness or poor fluid intake; weakness is chronic and not worse.)  Answer Assessment - Initial Assessment Questions 1. DESCRIPTION: "Describe how you are feeling."     Fatigue 2. SEVERITY: "How bad is it?"  "Can you stand and walk?"   - MILD (0-3): Feels weak or tired, but does not interfere with work, school or normal activities.   - MODERATE (4-7): Able to stand and walk; weakness interferes with work, school, or normal activities.   - SEVERE (8-10): Unable to stand or walk; unable to do usual activities.     Moderate 3. ONSET: "When did these symptoms begin?" (e.g., hours, days, weeks, months)     Last Monday 4. CAUSE: "What do you think is causing the weakness or fatigue?" (e.g., not drinking enough fluids, medical problem, trouble sleeping)     Unsure 5. NEW MEDICINES:  "Have you started on any new medicines recently?" (e.g., opioid pain medicines, benzodiazepines, muscle relaxants, antidepressants, antihistamines, neuroleptics, beta blockers)     No 6. OTHER SYMPTOMS: "Do you have any other symptoms?" (e.g., chest pain, fever, cough, SOB, vomiting, diarrhea, bleeding, other areas of pain)     Headache, nausea, dizziness 7. PREGNANCY: "Is there any chance you are pregnant?" "When was  your last menstrual period?"     No  Protocols used: Weakness (Generalized) and Fatigue-A-AH

## 2022-06-24 NOTE — Assessment & Plan Note (Signed)
Acute, new concern Reports fatigue, malaise with intermittent GI upset for the past 2 weeks - not resolving Will check for Mono, COVID, flu as well as CMP, CBC for potential etiology Flu A and B were negative today- other results to guide management Recommend using antihistamine for potential allergies Discussed potential for above causes as well as cold viruses, allergies,  Recommend masking to prevent spread of potential infections Follow up as needed and indicated by results or if further concerns arise

## 2022-06-24 NOTE — Assessment & Plan Note (Signed)
Recurrent,  Reports multiple tick bites over the past few months- most recent she is aware of was several weeks ago She is concerned her current fatigue, GI distress, and malaise is secondary to potential tick-borne illness Will run panels for Ehrlichia, RMSF and Lyme today Results to dictate further management Follow up as needed

## 2022-06-24 NOTE — Progress Notes (Signed)
Acute Office Visit   Patient: Laura Russo   DOB: 1986-09-18   36 y.o. Female  MRN: [id number] Visit Date: 06/24/2022  Today's healthcare provider: Dani Gobble Carmell Elgin, PA-C  Introduced myself to the patient as a Journalist, newspaper and provided education on APPs in clinical practice.    Chief Complaint  Patient presents with   Fatigue    For the past few weeks, with GI upset, dizziness and headache   Subjective    HPI HPI     Fatigue    Additional comments: For the past few weeks, with GI upset, dizziness and headache      Last edited by Jerelene Redden, CMA on 06/24/2022  9:48 AM.       Reports fatigue for the past 2 weeks Initially thought it was related to hormone changes from OCP but this has continued past expectations Reports some GI upset- nausea and dizziness, Headaches, loose stools  Monday had diarrhea and gas with some abdominal pain and "bubbling"  Increased pain in her typical pain points without recent exertion or trauma to those areas She reports recent inclusion in group activities Went to sisters house- with 2 members reporting some GI upset- unsure if infectious  States her boss had long-term Mono recently  She did a COVID test last weekend before engaging in group activities but it was negative   Reports she has had several tick bites over the summer  Most recent was a few weeks ago  She has a hx of tickborne illnesses in the past     Medications: Outpatient Medications Prior to Visit  Medication Sig   levonorgestrel (MIRENA, 52 MG,) 20 MCG/DAY IUD Take 1 device by intrauterine route.   fluticasone (FLONASE) 50 MCG/ACT nasal spray    ibuprofen (ADVIL) 200 MG tablet Take 200 mg by mouth every 6 (six) hours as needed.   LO LOESTRIN FE 1 MG-10 MCG / 10 MCG tablet Take 1 tablet by mouth daily.   Loratadine 10 MG CAPS Take 1 capsule by mouth daily as needed.   No facility-administered medications prior to visit.    Review of Systems   Constitutional:  Positive for fatigue. Negative for chills, diaphoresis and fever.  HENT:  Positive for sore throat (started today). Negative for congestion, ear pain, sinus pressure and sinus pain.   Respiratory:  Negative for cough, shortness of breath and wheezing.   Gastrointestinal:  Positive for abdominal pain, diarrhea and nausea. Negative for vomiting.  Musculoskeletal:  Negative for myalgias.  Neurological:  Positive for light-headedness and headaches. Negative for syncope and weakness.       Objective    BP 124/88   Pulse 83   Temp 98.7 F (37.1 C) (Oral)   Ht 5' 5.98" (1.676 m)   Wt 160 lb 8 oz (72.8 kg)   SpO2 100%   BMI 25.92 kg/m    Physical Exam Vitals reviewed.  Constitutional:      General: She is awake.     Appearance: Normal appearance. She is well-developed, well-groomed and normal weight.  HENT:     Head: Normocephalic and atraumatic.     Right Ear: Hearing, tympanic membrane, ear canal and external ear normal.     Left Ear: Hearing, tympanic membrane, ear canal and external ear normal.     Mouth/Throat:     Lips: Pink.     Pharynx: Oropharynx is clear. Uvula midline. No oropharyngeal exudate, posterior oropharyngeal erythema or uvula  swelling.  Eyes:     General: Lids are normal. Gaze aligned appropriately.     Extraocular Movements: Extraocular movements intact.     Conjunctiva/sclera: Conjunctivae normal.     Pupils: Pupils are equal, round, and reactive to light.  Neck:     Thyroid: No thyroid mass, thyromegaly or thyroid tenderness.  Cardiovascular:     Rate and Rhythm: Normal rate and regular rhythm.     Pulses: Normal pulses.          Radial pulses are 2+ on the right side and 2+ on the left side.     Heart sounds: Normal heart sounds. No murmur heard.    No friction rub. No gallop.  Pulmonary:     Effort: Pulmonary effort is normal.     Breath sounds: Normal breath sounds. No decreased air movement. No decreased breath sounds, wheezing,  rhonchi or rales.  Abdominal:     General: Abdomen is flat. Bowel sounds are normal.     Palpations: Abdomen is soft.     Tenderness: There is no abdominal tenderness.  Musculoskeletal:     Cervical back: Normal range of motion.     Right lower leg: No edema.     Left lower leg: No edema.  Neurological:     General: No focal deficit present.     Mental Status: She is alert and oriented to person, place, and time.     GCS: GCS eye subscore is 4. GCS verbal subscore is 5. GCS motor subscore is 6.     Cranial Nerves: No dysarthria or facial asymmetry.     Motor: No weakness, tremor or atrophy.  Psychiatric:        Attention and Perception: Attention and perception normal.        Mood and Affect: Mood and affect normal.        Speech: Speech normal.        Behavior: Behavior normal. Behavior is cooperative.       Results for orders placed or performed in visit on 06/24/22  Veritor Flu A/B Waived  Result Value Ref Range   Influenza A Negative Negative   Influenza B Negative Negative    Assessment & Plan      No follow-ups on file.       Problem List Items Addressed This Visit       Musculoskeletal and Integument   Tick bite    Recurrent,  Reports multiple tick bites over the past few months- most recent she is aware of was several weeks ago She is concerned her current fatigue, GI distress, and malaise is secondary to potential tick-borne illness Will run panels for Ehrlichia, RMSF and Lyme today Results to dictate further management Follow up as needed       Relevant Orders   Lyme Disease Serology w/Reflex   Rocky mtn spotted fvr abs pnl(IgG+IgM)   Ehrlichia Antibody Panel     Other   Fatigue - Primary    Acute, new concern Reports fatigue, malaise with intermittent GI upset for the past 2 weeks - not resolving Will check for Mono, COVID, flu as well as CMP, CBC for potential etiology Flu A and B were negative today- other results to guide  management Recommend using antihistamine for potential allergies Discussed potential for above causes as well as cold viruses, allergies,  Recommend masking to prevent spread of potential infections Follow up as needed and indicated by results or if further concerns arise  Relevant Orders   POCT Mono (Epstein Barr Virus)   CBC w/Diff   Comp Met (CMET)   Epstein-Barr virus VCA, IgG   Epstein-Barr virus VCA, IgM   CMV IgM   Novel Coronavirus, NAA ([facility])   Influenza a and b     No follow-ups on file.   I, Kaidence Sant E Bradd Merlos, PA-C, have reviewed all documentation for this visit. The documentation on 06/24/22 for the exam, diagnosis, procedures, and orders are all accurate and complete.   Talitha Givens, MHS, PA-C [facility]

## 2022-06-27 ENCOUNTER — Telehealth: Payer: Self-pay

## 2022-06-27 DIAGNOSIS — R5383 Other fatigue: Secondary | ICD-10-CM | POA: Diagnosis not present

## 2022-06-27 NOTE — Telephone Encounter (Signed)
Spoke with patient and informed her that her Covid test that was done on Friday 9/15 was not sent into [facility] for unknown reasons by lab tech. Apologized to her and ask if she wanted to come in today to retest. Patient stated that she would.

## 2022-06-28 LAB — EPSTEIN-BARR VIRUS VCA, IGG: EBV VCA IgG: >600 U/mL — ABNORMAL HIGH (ref 0.0–17.9)

## 2022-06-28 LAB — CBC WITH DIFFERENTIAL/PLATELET
Basophils Absolute: 0 10*3/uL (ref 0.0–0.2)
Basos: 0 %
EOS (ABSOLUTE): 0.1 10*3/uL (ref 0.0–0.4)
Eos: 1 %
Hematocrit: 44.7 % (ref 34.0–46.6)
Hemoglobin: 15 g/dL (ref 11.1–15.9)
Immature Grans (Abs): 0 10*3/uL (ref 0.0–0.1)
Immature Granulocytes: 0 %
Lymphocytes Absolute: 1.1 10*3/uL (ref 0.7–3.1)
Lymphs: 18 %
MCH: 30.6 pg (ref 26.6–33.0)
MCHC: 33.6 g/dL (ref 31.5–35.7)
MCV: 91 fL (ref 79–97)
Monocytes Absolute: 0.4 10*3/uL (ref 0.1–0.9)
Monocytes: 6 %
Neutrophils Absolute: 4.3 10*3/uL (ref 1.4–7.0)
Neutrophils: 75 %
Platelets: 361 10*3/uL (ref 150–450)
RBC: 4.9 x10E6/uL (ref 3.77–5.28)
RDW: 12 % (ref 11.7–15.4)
WBC: 5.8 10*3/uL (ref 3.4–10.8)

## 2022-06-28 LAB — COMPREHENSIVE METABOLIC PANEL
ALT: 20 IU/L (ref 0–32)
AST: 18 IU/L (ref 0–40)
Albumin/Globulin Ratio: 1.8 (ref 1.2–2.2)
Albumin: 4.6 g/dL (ref 3.9–4.9)
Alkaline Phosphatase: 63 IU/L (ref 44–121)
BUN/Creatinine Ratio: 12 (ref 9–23)
BUN: 11 mg/dL (ref 6–20)
Bilirubin Total: 0.3 mg/dL (ref 0.0–1.2)
CO2: 23 mmol/L (ref 20–29)
Calcium: 9.9 mg/dL (ref 8.7–10.2)
Chloride: 103 mmol/L (ref 96–106)
Creatinine, Ser: 0.91 mg/dL (ref 0.57–1.00)
Globulin, Total: 2.6 g/dL (ref 1.5–4.5)
Glucose: 84 mg/dL (ref 70–99)
Potassium: 4.4 mmol/L (ref 3.5–5.2)
Sodium: 141 mmol/L (ref 134–144)
Total Protein: 7.2 g/dL (ref 6.0–8.5)
eGFR: 83 mL/min/{1.73_m2} (ref >59)

## 2022-06-28 LAB — EHRLICHIA ANTIBODY PANEL
E. Chaffeensis (HME) IgM Titer: NEGATIVE
E.Chaffeensis (HME) IgG: NEGATIVE
HGE IgG Titer: NEGATIVE
HGE IgM Titer: NEGATIVE

## 2022-06-28 LAB — ROCKY MTN SPOTTED FVR ABS PNL(IGG+IGM)
RMSF IgG: NEGATIVE
RMSF IgM: 0.84 index (ref 0.00–0.89)

## 2022-06-28 LAB — EPSTEIN-BARR VIRUS VCA, IGM: EBV VCA IgM: <36 U/mL (ref 0.0–35.9)

## 2022-06-28 LAB — CMV IGM: CMV IgM Ser EIA-aCnc: <30 AU/mL (ref 0.0–29.9)

## 2022-06-28 LAB — LYME DISEASE SEROLOGY W/REFLEX: Lyme Total Antibody EIA: NEGATIVE

## 2022-06-29 LAB — NOVEL CORONAVIRUS, NAA: SARS-CoV-2, NAA: NOT DETECTED

## 2022-07-13 DIAGNOSIS — F411 Generalized anxiety disorder: Secondary | ICD-10-CM | POA: Diagnosis not present

## 2022-07-14 ENCOUNTER — Encounter: Payer: Self-pay | Admitting: Physician Assistant

## 2022-07-20 DIAGNOSIS — F411 Generalized anxiety disorder: Secondary | ICD-10-CM | POA: Diagnosis not present

## 2022-07-28 DIAGNOSIS — F411 Generalized anxiety disorder: Secondary | ICD-10-CM | POA: Diagnosis not present

## 2022-08-11 ENCOUNTER — Ambulatory Visit (INDEPENDENT_AMBULATORY_CARE_PROVIDER_SITE_OTHER): Payer: BC Managed Care – PPO | Admitting: Family Medicine

## 2022-08-11 ENCOUNTER — Encounter: Payer: Self-pay | Admitting: Family Medicine

## 2022-08-11 VITALS — BP 116/80 | HR 80 | Temp 98.5°F | Wt 160.0 lb

## 2022-08-11 DIAGNOSIS — J0100 Acute maxillary sinusitis, unspecified: Secondary | ICD-10-CM | POA: Diagnosis not present

## 2022-08-11 MED ORDER — HYDROCOD POLI-CHLORPHE POLI ER 10-8 MG/5ML PO SUER: 5.0000 mL | Freq: Two times a day (BID) | ORAL | 0 refills | Status: DC | PRN

## 2022-08-11 MED ORDER — FLUCONAZOLE 150 MG PO TABS: 150.0000 mg | ORAL_TABLET | Freq: Every day | ORAL | 0 refills | Status: DC

## 2022-08-11 MED ORDER — BENZONATATE 200 MG PO CAPS: 200.0000 mg | ORAL_CAPSULE | Freq: Two times a day (BID) | ORAL | 0 refills | Status: DC | PRN

## 2022-08-11 MED ORDER — AMOXICILLIN-POT CLAVULANATE 875-125 MG PO TABS: 1.0000 | ORAL_TABLET | Freq: Two times a day (BID) | ORAL | 0 refills | Status: DC

## 2022-08-11 NOTE — Progress Notes (Signed)
BP 116/80   Pulse 80   Temp 98.5 F (36.9 C)   Wt 160 lb (72.6 kg)   BMI 25.84 kg/m    Subjective:    Patient ID: Laura Russo, female    DOB: Aug 03, 1986, 36 y.o.   MRN: [id number]  HPI: Laura Russo is a 36 y.o. female  Chief Complaint  Patient presents with   Cough    Patient states she has been coughing and has congestion for 11 days. Patient is coughing at night and can not sleep, has taken 2 COVID test and both have been negative.    UPPER RESPIRATORY TRACT INFECTION Duration: 11 days Worst symptom: cough and congestion Fever: no Cough: yes Shortness of breath: no Wheezing: no Chest pain: no Chest tightness: no Chest congestion: no Nasal congestion: yes Runny nose: yes Post nasal drip: yes Sneezing: no Sore throat: yes Swollen glands: no Sinus pressure: yes Headache: yes Face pain: yes Toothache: yes Ear pain: no  Ear pressure: no  Eyes red/itching:no Eye drainage/crusting: no  Vomiting: no Rash: no Fatigue: yes Sick contacts: yes Strep contacts: no  Context: stable Recurrent sinusitis: no Relief with OTC cold/cough medications: no  Treatments attempted: nyquil    Relevant past medical, surgical, family and social history reviewed and updated as indicated. Interim medical history since our last visit reviewed. Allergies and medications reviewed and updated.  Review of Systems  Constitutional: Negative.   HENT:  Positive for congestion, postnasal drip and sinus pressure. Negative for dental problem, drooling, ear discharge, ear pain, facial swelling, hearing loss, mouth sores, nosebleeds, rhinorrhea, sinus pain, sneezing, sore throat, tinnitus, trouble swallowing and voice change.   Eyes: Negative.   Respiratory:  Positive for cough. Negative for apnea, choking, chest tightness, shortness of breath, wheezing and stridor.   Cardiovascular: Negative.   Psychiatric/Behavioral: Negative.      Per HPI unless specifically indicated  above     Objective:    BP 116/80   Pulse 80   Temp 98.5 F (36.9 C)   Wt 160 lb (72.6 kg)   BMI 25.84 kg/m   Wt Readings from Last 3 Encounters:  08/11/22 160 lb (72.6 kg)  06/24/22 160 lb 8 oz (72.8 kg)  12/09/21 161 lb 3.2 oz (73.1 kg)    Physical Exam Vitals and nursing note reviewed.  Constitutional:      General: She is not in acute distress.    Appearance: Normal appearance. She is normal weight. She is not ill-appearing, toxic-appearing or diaphoretic.  HENT:     Head: Normocephalic and atraumatic.     Right Ear: Tympanic membrane, ear canal and external ear normal.     Left Ear: Tympanic membrane, ear canal and external ear normal.     Nose: Congestion and rhinorrhea present.     Mouth/Throat:     Mouth: Mucous membranes are moist.     Pharynx: Oropharynx is clear. No oropharyngeal exudate or posterior oropharyngeal erythema.  Eyes:     General: No scleral icterus.       Right eye: No discharge.        Left eye: No discharge.     Extraocular Movements: Extraocular movements intact.     Conjunctiva/sclera: Conjunctivae normal.     Pupils: Pupils are equal, round, and reactive to light.  Cardiovascular:     Rate and Rhythm: Normal rate and regular rhythm.     Pulses: Normal pulses.     Heart sounds: Normal heart sounds. No murmur heard.  No friction rub. No gallop.  Pulmonary:     Effort: Pulmonary effort is normal. No respiratory distress.     Breath sounds: Normal breath sounds. No stridor. No wheezing, rhonchi or rales.  Chest:     Chest wall: No tenderness.  Musculoskeletal:        General: Normal range of motion.     Cervical back: Normal range of motion and neck supple.  Skin:    General: Skin is warm and dry.     Capillary Refill: Capillary refill takes less than 2 seconds.     Coloration: Skin is not jaundiced or pale.     Findings: No bruising, erythema, lesion or rash.  Neurological:     General: No focal deficit present.     Mental Status:  She is alert and oriented to person, place, and time. Mental status is at baseline.  Psychiatric:        Mood and Affect: Mood normal.        Behavior: Behavior normal.        Thought Content: Thought content normal.        Judgment: Judgment normal.     Results for orders placed or performed in visit on 06/24/22  Novel Coronavirus, NAA ([facility])   Specimen: Nasopharyngeal(NP) swabs in vial transport medium  Result Value Ref Range   SARS-CoV-2, NAA Not Detected Not Detected  CBC w/Diff  Result Value Ref Range   WBC 5.8 3.4 - 10.8 x10E3/uL   RBC 4.90 3.77 - 5.28 x10E6/uL   Hemoglobin 15.0 11.1 - 15.9 g/dL   Hematocrit 44.7 34.0 - 46.6 %   MCV 91 79 - 97 fL   MCH 30.6 26.6 - 33.0 pg   MCHC 33.6 31.5 - 35.7 g/dL   RDW 12.0 11.7 - 15.4 %   Platelets 361 150 - 450 x10E3/uL   Neutrophils 75 Not Estab. %   Lymphs 18 Not Estab. %   Monocytes 6 Not Estab. %   Eos 1 Not Estab. %   Basos 0 Not Estab. %   Neutrophils Absolute 4.3 1.4 - 7.0 x10E3/uL   Lymphocytes Absolute 1.1 0.7 - 3.1 x10E3/uL   Monocytes Absolute 0.4 0.1 - 0.9 x10E3/uL   EOS (ABSOLUTE) 0.1 0.0 - 0.4 x10E3/uL   Basophils Absolute 0.0 0.0 - 0.2 x10E3/uL   Immature Granulocytes 0 Not Estab. %   Immature Grans (Abs) 0.0 0.0 - 0.1 x10E3/uL  Comp Met (CMET)  Result Value Ref Range   Glucose 84 70 - 99 mg/dL   BUN 11 6 - 20 mg/dL   Creatinine, Ser 0.91 0.57 - 1.00 mg/dL   eGFR 83 >59 mL/min/1.73   BUN/Creatinine Ratio 12 9 - 23   Sodium 141 134 - 144 mmol/L   Potassium 4.4 3.5 - 5.2 mmol/L   Chloride 103 96 - 106 mmol/L   CO2 23 20 - 29 mmol/L   Calcium 9.9 8.7 - 10.2 mg/dL   Total Protein 7.2 6.0 - 8.5 g/dL   Albumin 4.6 3.9 - 4.9 g/dL   Globulin, Total 2.6 1.5 - 4.5 g/dL   Albumin/Globulin Ratio 1.8 1.2 - 2.2   Bilirubin Total 0.3 0.0 - 1.2 mg/dL   Alkaline Phosphatase 63 44 - 121 IU/L   AST 18 0 - 40 IU/L   ALT 20 0 - 32 IU/L  Epstein-Barr virus VCA, IgG  Result Value Ref Range   EBV VCA IgG >600.0 (H) 0.0  - 17.9 U/mL  Epstein-Barr virus VCA, IgM  Result Value Ref Range   EBV VCA IgM <36.0 0.0 - 35.9 U/mL  CMV IgM  Result Value Ref Range   CMV IgM Ser EIA-aCnc <30.0 0.0 - 29.9 AU/mL  Lyme Disease Serology w/Reflex  Result Value Ref Range   Lyme Total Antibody EIA Negative Negative  Rocky mtn spotted fvr abs pnl(IgG+IgM)  Result Value Ref Range   RMSF IgG Negative Negative   RMSF IgM 0.84 0.00 - 5.80 index  Ehrlichia Antibody Panel  Result Value Ref Range   E.Chaffeensis (HME) IgG Negative Neg:<1:64   E. Chaffeensis (HME) IgM Titer Negative Neg:<1:20   HGE IgG Titer Negative Neg:<1:64   HGE IgM Titer Negative Neg:<1:20   Result Comment: Comment   Veritor Flu A/B Waived  Result Value Ref Range   Influenza A Negative Negative   Influenza B Negative Negative      Assessment & Plan:   Problem List Items Addressed This Visit   None Visit Diagnoses     Acute non-recurrent maxillary sinusitis    -  Primary   Will treat with augmentin, tussionex and tessalon. Call if not getting better or getting worse. Continue to monitor.   Relevant Medications   chlorpheniramine-HYDROcodone (TUSSIONEX) 10-8 MG/5ML   benzonatate (TESSALON) 200 MG capsule   amoxicillin-clavulanate (AUGMENTIN) 875-125 MG tablet   fluconazole (DIFLUCAN) 150 MG tablet        Follow up plan: Return As able for physical.

## 2022-08-17 DIAGNOSIS — F411 Generalized anxiety disorder: Secondary | ICD-10-CM | POA: Diagnosis not present

## 2022-09-07 DIAGNOSIS — F411 Generalized anxiety disorder: Secondary | ICD-10-CM | POA: Diagnosis not present

## 2022-09-16 ENCOUNTER — Encounter: Payer: Self-pay | Admitting: Family Medicine

## 2022-09-16 ENCOUNTER — Ambulatory Visit (INDEPENDENT_AMBULATORY_CARE_PROVIDER_SITE_OTHER): Payer: BC Managed Care – PPO | Admitting: Family Medicine

## 2022-09-16 VITALS — BP 109/71 | HR 87 | Temp 98.4°F | Ht 68.0 in | Wt 166.4 lb

## 2022-09-16 DIAGNOSIS — Z0000 Encounter for general adult medical examination without abnormal findings: Secondary | ICD-10-CM

## 2022-09-16 DIAGNOSIS — Z23 Encounter for immunization: Secondary | ICD-10-CM

## 2022-09-16 NOTE — Progress Notes (Signed)
BP 109/71   Pulse 87   Temp 98.4 F (36.9 C) (Oral)   Ht _0  (1.727 m)   Wt 166 lb 6.4 oz (75.5 kg)   LMP 08/19/2022 (Approximate)   SpO2 99%   BMI 25.30 kg/m    Subjective:    Patient ID: Laura Russo, female    DOB: October 07, 1986, 36 y.o.   MRN: [id number]  HPI: Laura Russo is a 36 y.o. female presenting on 09/16/2022 for comprehensive medical examination. Current medical complaints include:none  Menopausal Symptoms: no  Depression Screen done today and results listed below:     09/16/2022    8:18 AM 08/11/2022    2:53 PM 06/24/2022   10:05 AM 08/24/2021   10:39 AM 06/29/2021    1:48 PM  Depression screen PHQ 2/9  Decreased Interest _1 Down, Depressed, Hopeless _2 PHQ - 2 Score _3 Altered sleeping 0 _4 Tired, decreased energy _5 Change in appetite 0 1 0 0 1  Feeling bad or failure about yourself  0 0 _6 Trouble concentrating _7 Moving slowly or fidgety/restless 0 0 0 0 0  Suicidal thoughts 0 0 0 0 0  PHQ-9 Score _8 Difficult doing work/chores _9     Past Medical History:  Past Medical History:  Diagnosis Date   Depression     Surgical History:  Past Surgical History:  Procedure Laterality Date   WISDOM TOOTH EXTRACTION      Medications:  Current Outpatient Medications on File Prior to Visit  Medication Sig   fluticasone (FLONASE) 50 MCG/ACT nasal spray    ibuprofen (ADVIL) 200 MG tablet Take 200 mg by mouth every 6 (six) hours as needed.   levonorgestrel (MIRENA, 52 MG,) 20 MCG/DAY IUD Take 1 device by intrauterine route.   LO LOESTRIN FE 1 MG-10 MCG / 10 MCG tablet Take 1 tablet by mouth daily.   Loratadine 10 MG CAPS Take 1 capsule by mouth daily as needed.   No current facility-administered medications on file prior to visit.    Allergies:  Allergies  Allergen Reactions    Erythromycin Base Rash    Social History:  Social History   Socioeconomic History   Marital status: Soil scientist    Spouse name: Not on file   Number of children: Not on file   Years of education: Not on file   Highest education level: Not on file  Occupational History   Not on file  Tobacco Use   Smoking status: Never   Smokeless tobacco: Never  Vaping Use   Vaping Use: Never used  Substance and Sexual Activity   Alcohol use: Yes    Comment: on occasion   Drug use: Never   Sexual activity: Yes  Other Topics Concern   Not on file  Social History Narrative   Not on file   Social Determinants of Health   Financial Resource Strain: Not on file  Food Insecurity: Not on file  Transportation Needs: Not on file  Physical Activity: Not on file  Stress: Not on file  Social Connections: Not on file  Intimate Partner Violence: Not on file   Social History   Tobacco Use  Smoking Status Never  Smokeless Tobacco Never   Social History  Substance and Sexual Activity  Alcohol Use Yes   Comment: on occasion    Family History:  Family History  Problem Relation Age of Onset   Depression Mother    Asthma Mother    Heart disease Father    Diverticulosis Father    Heart disease Paternal Uncle    Heart disease Maternal Grandmother    Dementia Maternal Grandmother    Dementia Maternal Grandfather    Colon cancer Paternal Grandmother     Past medical history, surgical history, medications, allergies, family history and social history reviewed with patient today and changes made to appropriate areas of the chart.   Review of Systems  Constitutional:  Positive for malaise/fatigue. Negative for chills, diaphoresis, fever and weight loss.  HENT: Negative.    Eyes: Negative.   Respiratory:  Positive for cough. Negative for hemoptysis, sputum production, shortness of breath and wheezing.   Cardiovascular: Negative.   Gastrointestinal: Negative.   Genitourinary:  Negative.   Musculoskeletal:  Positive for myalgias and neck pain. Negative for back pain, falls and joint pain.  Skin: Negative.   Neurological:  Positive for tingling (R hand). Negative for dizziness, tremors, sensory change, speech change, focal weakness, seizures, loss of consciousness, weakness and headaches.  Endo/Heme/Allergies:  Positive for environmental allergies. Negative for polydipsia. Does not bruise/bleed easily.  Psychiatric/Behavioral: Negative.         Mood is fair to midling   All other ROS negative except what is listed above and in the HPI.      Objective:    BP 109/71   Pulse 87   Temp 98.4 F (36.9 C) (Oral)   Ht _0  (1.727 m)   Wt 166 lb 6.4 oz (75.5 kg)   LMP 08/19/2022 (Approximate)   SpO2 99%   BMI 25.30 kg/m   Wt Readings from Last 3 Encounters:  09/16/22 166 lb 6.4 oz (75.5 kg)  08/11/22 160 lb (72.6 kg)  06/24/22 160 lb 8 oz (72.8 kg)    Physical Exam Vitals and nursing note reviewed.  Constitutional:      General: She is not in acute distress.    Appearance: Normal appearance. She is normal weight. She is not ill-appearing, toxic-appearing or diaphoretic.  HENT:     Head: Normocephalic and atraumatic.     Right Ear: Tympanic membrane, ear canal and external ear normal. There is no impacted cerumen.     Left Ear: Tympanic membrane, ear canal and external ear normal. There is no impacted cerumen.     Nose: Nose normal. No congestion or rhinorrhea.     Mouth/Throat:     Mouth: Mucous membranes are moist.     Pharynx: Oropharynx is clear. No oropharyngeal exudate or posterior oropharyngeal erythema.  Eyes:     General: No scleral icterus.       Right eye: No discharge.        Left eye: No discharge.     Extraocular Movements: Extraocular movements intact.     Conjunctiva/sclera: Conjunctivae normal.     Pupils: Pupils are equal, round, and reactive to light.  Neck:     Vascular: No carotid bruit.  Cardiovascular:     Rate and Rhythm:  Normal rate and regular rhythm.     Pulses: Normal pulses.     Heart sounds: No murmur heard.    No friction rub. No gallop.  Pulmonary:     Effort: Pulmonary effort is normal. No respiratory distress.     Breath sounds: Normal  breath sounds. No stridor. No wheezing, rhonchi or rales.  Chest:     Chest wall: No tenderness.  Abdominal:     General: Abdomen is flat. Bowel sounds are normal. There is no distension.     Palpations: Abdomen is soft. There is no mass.     Tenderness: There is no abdominal tenderness. There is no right CVA tenderness, left CVA tenderness, guarding or rebound.     Hernia: No hernia is present.  Genitourinary:    Comments: Breast and pelvic exams deferred with shared decision making Musculoskeletal:        General: No swelling, tenderness, deformity or signs of injury.     Cervical back: Normal range of motion and neck supple. No rigidity. No muscular tenderness.     Right lower leg: No edema.     Left lower leg: No edema.  Lymphadenopathy:     Cervical: No cervical adenopathy.  Skin:    General: Skin is warm and dry.     Capillary Refill: Capillary refill takes less than 2 seconds.     Coloration: Skin is not jaundiced or pale.     Findings: No bruising, erythema, lesion or rash.  Neurological:     General: No focal deficit present.     Mental Status: She is alert and oriented to person, place, and time. Mental status is at baseline.     Cranial Nerves: No cranial nerve deficit.     Sensory: No sensory deficit.     Motor: No weakness.     Coordination: Coordination normal.     Gait: Gait normal.     Deep Tendon Reflexes: Reflexes normal.  Psychiatric:        Mood and Affect: Mood normal.        Behavior: Behavior normal.        Thought Content: Thought content normal.        Judgment: Judgment normal.     Results for orders placed or performed in visit on 06/24/22  Novel Coronavirus, NAA ([facility])   Specimen: Nasopharyngeal(NP) swabs in vial  transport medium  Result Value Ref Range   SARS-CoV-2, NAA Not Detected Not Detected  CBC w/Diff  Result Value Ref Range   WBC 5.8 3.4 - 10.8 x10E3/uL   RBC 4.90 3.77 - 5.28 x10E6/uL   Hemoglobin 15.0 11.1 - 15.9 g/dL   Hematocrit 44.7 34.0 - 46.6 %   MCV 91 79 - 97 fL   MCH 30.6 26.6 - 33.0 pg   MCHC 33.6 31.5 - 35.7 g/dL   RDW 12.0 11.7 - 15.4 %   Platelets 361 150 - 450 x10E3/uL   Neutrophils 75 Not Estab. %   Lymphs 18 Not Estab. %   Monocytes 6 Not Estab. %   Eos 1 Not Estab. %   Basos 0 Not Estab. %   Neutrophils Absolute 4.3 1.4 - 7.0 x10E3/uL   Lymphocytes Absolute 1.1 0.7 - 3.1 x10E3/uL   Monocytes Absolute 0.4 0.1 - 0.9 x10E3/uL   EOS (ABSOLUTE) 0.1 0.0 - 0.4 x10E3/uL   Basophils Absolute 0.0 0.0 - 0.2 x10E3/uL   Immature Granulocytes 0 Not Estab. %   Immature Grans (Abs) 0.0 0.0 - 0.1 x10E3/uL  Comp Met (CMET)  Result Value Ref Range   Glucose 84 70 - 99 mg/dL   BUN 11 6 - 20 mg/dL   Creatinine, Ser 0.91 0.57 - 1.00 mg/dL   eGFR 83 >59 mL/min/1.73   BUN/Creatinine Ratio 12 9 - 23  Sodium 141 134 - 144 mmol/L   Potassium 4.4 3.5 - 5.2 mmol/L   Chloride 103 96 - 106 mmol/L   CO2 23 20 - 29 mmol/L   Calcium 9.9 8.7 - 10.2 mg/dL   Total Protein 7.2 6.0 - 8.5 g/dL   Albumin 4.6 3.9 - 4.9 g/dL   Globulin, Total 2.6 1.5 - 4.5 g/dL   Albumin/Globulin Ratio 1.8 1.2 - 2.2   Bilirubin Total 0.3 0.0 - 1.2 mg/dL   Alkaline Phosphatase 63 44 - 121 IU/L   AST 18 0 - 40 IU/L   ALT 20 0 - 32 IU/L  Epstein-Barr virus VCA, IgG  Result Value Ref Range   EBV VCA IgG >600.0 (H) 0.0 - 17.9 U/mL  Epstein-Barr virus VCA, IgM  Result Value Ref Range   EBV VCA IgM <36.0 0.0 - 35.9 U/mL  CMV IgM  Result Value Ref Range   CMV IgM Ser EIA-aCnc <30.0 0.0 - 29.9 AU/mL  Lyme Disease Serology w/Reflex  Result Value Ref Range   Lyme Total Antibody EIA Negative Negative  Rocky mtn spotted fvr abs pnl(IgG+IgM)  Result Value Ref Range   RMSF IgG Negative Negative   RMSF IgM 0.84  0.00 - 2.01 index  Ehrlichia Antibody Panel  Result Value Ref Range   E.Chaffeensis (HME) IgG Negative Neg:<1:64   E. Chaffeensis (HME) IgM Titer Negative Neg:<1:20   HGE IgG Titer Negative Neg:<1:64   HGE IgM Titer Negative Neg:<1:20   Result Comment: Comment   Veritor Flu A/B Waived  Result Value Ref Range   Influenza A Negative Negative   Influenza B Negative Negative      Assessment & Plan:   Problem List Items Addressed This Visit   None Visit Diagnoses     Routine general medical examination at a health care facility    -  Primary   Vaccines up to date. Screening labs checked today. Pap through GYN. Continue diet and exercise. Call with any concerns.   Relevant Orders   CBC with Differential/Platelet   Comprehensive metabolic panel   Lipid Panel w/o Chol/HDL Ratio   Urinalysis, Routine w reflex microscopic   TSH   HIV Antibody (routine testing w rflx)   Hepatitis C Antibody   Need for influenza vaccination       Relevant Orders   Flu Vaccine QUAD 9moIM (Fluarix, Fluzone & Alfiuria Quad PF) (Completed)        Follow up plan: Return in about 1 year (around 09/17/2023) for physical.   LABORATORY TESTING:  - Pap smear: done elsewhere  IMMUNIZATIONS:   - Tdap: Tetanus vaccination status reviewed: last tetanus booster within 10 years. - Influenza: Administered today - Pneumovax: Not applicable - Prevnar: Not applicable - COVID: Up to date - HPV: Up to date   PATIENT COUNSELING:   Advised to take 1 mg of folate supplement per day if capable of pregnancy.   Sexuality: Discussed sexually transmitted diseases, partner selection, use of condoms, avoidance of unintended pregnancy  and contraceptive alternatives.   Advised to avoid cigarette smoking.  I discussed with the patient that most people either abstain from alcohol or drink within safe limits (<=14/week and <=4 drinks/occasion for males, <=7/weeks and <= 3 drinks/occasion for females) and that the risk  for alcohol disorders and other health effects rises proportionally with the number of drinks per week and how often a drinker exceeds daily limits.  Discussed cessation/primary prevention of drug use and availability of treatment for abuse.  Diet: Encouraged to adjust caloric intake to maintain  or achieve ideal body weight, to reduce intake of dietary saturated fat and total fat, to limit sodium intake by avoiding high sodium foods and not adding table salt, and to maintain adequate dietary potassium and calcium preferably from fresh fruits, vegetables, and low-fat dairy products.    stressed the importance of regular exercise  Injury prevention: Discussed safety belts, safety helmets, smoke detector, smoking near bedding or upholstery.   Dental health: Discussed importance of regular tooth brushing, flossing, and dental visits.    NEXT PREVENTATIVE PHYSICAL DUE IN 1 YEAR. Return in about 1 year (around 09/17/2023) for physical.

## 2022-09-17 LAB — COMPREHENSIVE METABOLIC PANEL
ALT: 25 IU/L (ref 0–32)
AST: 19 IU/L (ref 0–40)
Albumin/Globulin Ratio: 1.7 (ref 1.2–2.2)
Albumin: 4.3 g/dL (ref 3.9–4.9)
Alkaline Phosphatase: 70 IU/L (ref 44–121)
BUN/Creatinine Ratio: 13 (ref 9–23)
BUN: 11 mg/dL (ref 6–20)
Bilirubin Total: 0.3 mg/dL (ref 0.0–1.2)
CO2: 23 mmol/L (ref 20–29)
Calcium: 9.7 mg/dL (ref 8.7–10.2)
Chloride: 103 mmol/L (ref 96–106)
Creatinine, Ser: 0.85 mg/dL (ref 0.57–1.00)
Globulin, Total: 2.5 g/dL (ref 1.5–4.5)
Glucose: 82 mg/dL (ref 70–99)
Potassium: 4.2 mmol/L (ref 3.5–5.2)
Sodium: 140 mmol/L (ref 134–144)
Total Protein: 6.8 g/dL (ref 6.0–8.5)
eGFR: 90 mL/min/{1.73_m2} (ref >59)

## 2022-09-17 LAB — MICROSCOPIC EXAMINATION
Casts: NONE SEEN /lpf
RBC, Urine: NONE SEEN /hpf (ref 0–2)

## 2022-09-17 LAB — URINALYSIS, ROUTINE W REFLEX MICROSCOPIC
Bilirubin, UA: NEGATIVE
Glucose, UA: NEGATIVE
Ketones, UA: NEGATIVE
Nitrite, UA: NEGATIVE
Protein,UA: NEGATIVE
RBC, UA: NEGATIVE
Specific Gravity, UA: 1.016 (ref 1.005–1.030)
Urobilinogen, Ur: 0.2 mg/dL (ref 0.2–1.0)
pH, UA: 5.5 (ref 5.0–7.5)

## 2022-09-17 LAB — CBC WITH DIFFERENTIAL/PLATELET
Basophils Absolute: 0 10*3/uL (ref 0.0–0.2)
Basos: 1 %
EOS (ABSOLUTE): 0.1 10*3/uL (ref 0.0–0.4)
Eos: 2 %
Hematocrit: 42.1 % (ref 34.0–46.6)
Hemoglobin: 13.8 g/dL (ref 11.1–15.9)
Immature Grans (Abs): 0 10*3/uL (ref 0.0–0.1)
Immature Granulocytes: 0 %
Lymphocytes Absolute: 1 10*3/uL (ref 0.7–3.1)
Lymphs: 18 %
MCH: 30.3 pg (ref 26.6–33.0)
MCHC: 32.8 g/dL (ref 31.5–35.7)
MCV: 93 fL (ref 79–97)
Monocytes Absolute: 0.5 10*3/uL (ref 0.1–0.9)
Monocytes: 9 %
Neutrophils Absolute: 3.7 10*3/uL (ref 1.4–7.0)
Neutrophils: 70 %
Platelets: 385 10*3/uL (ref 150–450)
RBC: 4.55 x10E6/uL (ref 3.77–5.28)
RDW: 11.8 % (ref 11.7–15.4)
WBC: 5.3 10*3/uL (ref 3.4–10.8)

## 2022-09-17 LAB — LIPID PANEL W/O CHOL/HDL RATIO
Cholesterol, Total: 181 mg/dL (ref 100–199)
HDL: 60 mg/dL (ref >39)
LDL Chol Calc (NIH): 100 mg/dL — ABNORMAL HIGH (ref 0–99)
Triglycerides: 117 mg/dL (ref 0–149)
VLDL Cholesterol Cal: 21 mg/dL (ref 5–40)

## 2022-09-17 LAB — HIV ANTIBODY (ROUTINE TESTING W REFLEX): HIV Screen 4th Generation wRfx: NONREACTIVE

## 2022-09-17 LAB — TSH: TSH: 2.1 u[IU]/mL (ref 0.450–4.500)

## 2022-09-17 LAB — HEPATITIS C ANTIBODY: Hep C Virus Ab: NONREACTIVE

## 2022-09-21 DIAGNOSIS — L570 Actinic keratosis: Secondary | ICD-10-CM | POA: Diagnosis not present

## 2022-09-21 DIAGNOSIS — L02425 Furuncle of right lower limb: Secondary | ICD-10-CM | POA: Diagnosis not present

## 2022-09-21 DIAGNOSIS — L02426 Furuncle of left lower limb: Secondary | ICD-10-CM | POA: Diagnosis not present

## 2022-09-21 DIAGNOSIS — L2089 Other atopic dermatitis: Secondary | ICD-10-CM | POA: Diagnosis not present

## 2022-09-21 DIAGNOSIS — L578 Other skin changes due to chronic exposure to nonionizing radiation: Secondary | ICD-10-CM | POA: Diagnosis not present

## 2022-09-21 DIAGNOSIS — X32XXXA Exposure to sunlight, initial encounter: Secondary | ICD-10-CM | POA: Diagnosis not present

## 2022-09-21 DIAGNOSIS — D225 Melanocytic nevi of trunk: Secondary | ICD-10-CM | POA: Diagnosis not present

## 2022-10-12 DIAGNOSIS — F411 Generalized anxiety disorder: Secondary | ICD-10-CM | POA: Diagnosis not present

## 2022-11-09 DIAGNOSIS — F411 Generalized anxiety disorder: Secondary | ICD-10-CM | POA: Diagnosis not present

## 2022-12-02 DIAGNOSIS — F411 Generalized anxiety disorder: Secondary | ICD-10-CM | POA: Diagnosis not present

## 2022-12-21 DIAGNOSIS — F411 Generalized anxiety disorder: Secondary | ICD-10-CM | POA: Diagnosis not present

## 2022-12-29 DIAGNOSIS — F411 Generalized anxiety disorder: Secondary | ICD-10-CM | POA: Diagnosis not present

## 2023-01-06 DIAGNOSIS — F411 Generalized anxiety disorder: Secondary | ICD-10-CM | POA: Diagnosis not present

## 2023-01-19 DIAGNOSIS — F411 Generalized anxiety disorder: Secondary | ICD-10-CM | POA: Diagnosis not present

## 2023-01-27 DIAGNOSIS — F411 Generalized anxiety disorder: Secondary | ICD-10-CM | POA: Diagnosis not present

## 2023-02-02 DIAGNOSIS — F411 Generalized anxiety disorder: Secondary | ICD-10-CM | POA: Diagnosis not present

## 2023-02-14 DIAGNOSIS — F322 Major depressive disorder, single episode, severe without psychotic features: Secondary | ICD-10-CM | POA: Diagnosis not present

## 2023-02-14 DIAGNOSIS — F4010 Social phobia, unspecified: Secondary | ICD-10-CM | POA: Diagnosis not present

## 2023-02-14 DIAGNOSIS — Z630 Problems in relationship with spouse or partner: Secondary | ICD-10-CM | POA: Diagnosis not present

## 2023-02-14 DIAGNOSIS — Z563 Stressful work schedule: Secondary | ICD-10-CM | POA: Diagnosis not present

## 2023-02-24 DIAGNOSIS — F4010 Social phobia, unspecified: Secondary | ICD-10-CM | POA: Diagnosis not present

## 2023-02-24 DIAGNOSIS — F322 Major depressive disorder, single episode, severe without psychotic features: Secondary | ICD-10-CM | POA: Diagnosis not present

## 2023-02-24 DIAGNOSIS — Z630 Problems in relationship with spouse or partner: Secondary | ICD-10-CM | POA: Diagnosis not present

## 2023-02-24 DIAGNOSIS — Z563 Stressful work schedule: Secondary | ICD-10-CM | POA: Diagnosis not present

## 2023-02-27 DIAGNOSIS — F411 Generalized anxiety disorder: Secondary | ICD-10-CM | POA: Diagnosis not present

## 2023-03-10 DIAGNOSIS — F411 Generalized anxiety disorder: Secondary | ICD-10-CM | POA: Diagnosis not present

## 2023-03-15 DIAGNOSIS — F322 Major depressive disorder, single episode, severe without psychotic features: Secondary | ICD-10-CM | POA: Diagnosis not present

## 2023-03-15 DIAGNOSIS — Z563 Stressful work schedule: Secondary | ICD-10-CM | POA: Diagnosis not present

## 2023-03-15 DIAGNOSIS — F4010 Social phobia, unspecified: Secondary | ICD-10-CM | POA: Diagnosis not present

## 2023-03-15 DIAGNOSIS — Z630 Problems in relationship with spouse or partner: Secondary | ICD-10-CM | POA: Diagnosis not present

## 2023-03-21 DIAGNOSIS — F411 Generalized anxiety disorder: Secondary | ICD-10-CM | POA: Diagnosis not present

## 2023-04-10 DIAGNOSIS — F411 Generalized anxiety disorder: Secondary | ICD-10-CM | POA: Diagnosis not present

## 2023-04-11 DIAGNOSIS — Z113 Encounter for screening for infections with a predominantly sexual mode of transmission: Secondary | ICD-10-CM | POA: Diagnosis not present

## 2023-04-11 DIAGNOSIS — Z1331 Encounter for screening for depression: Secondary | ICD-10-CM | POA: Diagnosis not present

## 2023-04-11 DIAGNOSIS — Z01419 Encounter for gynecological examination (general) (routine) without abnormal findings: Secondary | ICD-10-CM | POA: Diagnosis not present

## 2023-04-11 DIAGNOSIS — Z124 Encounter for screening for malignant neoplasm of cervix: Secondary | ICD-10-CM | POA: Diagnosis not present

## 2023-04-17 DIAGNOSIS — F4010 Social phobia, unspecified: Secondary | ICD-10-CM | POA: Diagnosis not present

## 2023-04-17 DIAGNOSIS — F322 Major depressive disorder, single episode, severe without psychotic features: Secondary | ICD-10-CM | POA: Diagnosis not present

## 2023-04-17 DIAGNOSIS — Z563 Stressful work schedule: Secondary | ICD-10-CM | POA: Diagnosis not present

## 2023-04-17 DIAGNOSIS — Z630 Problems in relationship with spouse or partner: Secondary | ICD-10-CM | POA: Diagnosis not present

## 2023-04-27 DIAGNOSIS — F411 Generalized anxiety disorder: Secondary | ICD-10-CM | POA: Diagnosis not present

## 2023-05-03 DIAGNOSIS — Z630 Problems in relationship with spouse or partner: Secondary | ICD-10-CM | POA: Diagnosis not present

## 2023-05-03 DIAGNOSIS — F322 Major depressive disorder, single episode, severe without psychotic features: Secondary | ICD-10-CM | POA: Diagnosis not present

## 2023-05-03 DIAGNOSIS — Z563 Stressful work schedule: Secondary | ICD-10-CM | POA: Diagnosis not present

## 2023-05-03 DIAGNOSIS — F4010 Social phobia, unspecified: Secondary | ICD-10-CM | POA: Diagnosis not present

## 2023-05-09 DIAGNOSIS — F411 Generalized anxiety disorder: Secondary | ICD-10-CM | POA: Diagnosis not present

## 2023-05-30 ENCOUNTER — Telehealth (INDEPENDENT_AMBULATORY_CARE_PROVIDER_SITE_OTHER): Payer: BC Managed Care – PPO | Admitting: Physician Assistant

## 2023-05-30 ENCOUNTER — Encounter: Payer: Self-pay | Admitting: Physician Assistant

## 2023-05-30 VITALS — Temp 99.0°F

## 2023-05-30 DIAGNOSIS — R110 Nausea: Secondary | ICD-10-CM

## 2023-05-30 DIAGNOSIS — R197 Diarrhea, unspecified: Secondary | ICD-10-CM

## 2023-05-30 DIAGNOSIS — K529 Noninfective gastroenteritis and colitis, unspecified: Secondary | ICD-10-CM

## 2023-05-30 MED ORDER — ONDANSETRON HCL 4 MG PO TABS: 4.0000 mg | ORAL_TABLET | Freq: Three times a day (TID) | ORAL | 0 refills | Status: DC | PRN

## 2023-05-30 NOTE — Patient Instructions (Addendum)
At this time I suspect you may have a viral GI illness  This can sometimes lead to nausea, vomiting, diarrhea  I have sent in a script for Zofran for you to take to help with the nausea and vomiting This can cause constipation so it may help a bit with the diarrhea   Make sure you are staying well hydrated and try to eat bland foods in small portions throughout the day until you are feeling better   You can use Pepto or Pepcid to help with heartburn and abdominal cramping  You can alternate Tylenol and Ibuprofen every 4 hours to help with fever and muscle aches

## 2023-05-30 NOTE — Progress Notes (Unsigned)
Virtual Visit via Video Note  I connected with Laura Russo on 05/30/23 at  2:40 PM EDT by a video enabled telemedicine application and verified that I am speaking with the correct person using two identifiers.  Today's Provider: Jacquelin Hawking, MHS, PA-C Introduced myself to the patient as a PA-C and provided education on APPs in clinical practice.   Location: Patient: at home  Provider: [facility], [place], [place]    I discussed the limitations of evaluation and management by telemedicine and the availability of in person appointments. The patient expressed understanding and agreed to proceed.    Chief Complaint  Patient presents with   Diarrhea   Fever   Headache   Fatigue   Generalized Body Aches    Patient says she started having Sunday night. Patient says she swabbed for COVID on Saturday. Patient says she has taken Ibuprofen to help with muscle related pain.    Abdominal Pain    History of Present Illness:    Concern for viral/ URI illness  Onset: sudden  Duration: started on Sun night  She reports diarrhea, fever, stomach cramps, nausea, belching, fatigue, headaches, body aches, low fever  She lives alone and the children she was around on Sat are not reporting similar symptoms  She has not tested for COVID since Sat - negative  She has not seen any ticks lately or removed any imbedded ticks in the last few days  Interventions: On Sunday she maybe took some Ibuprofen      Review of Systems  Constitutional:  Positive for fever and malaise/fatigue.  HENT:  Negative for congestion and ear pain.   Respiratory:  Negative for cough, shortness of breath and wheezing.   Gastrointestinal:  Positive for abdominal pain, diarrhea and nausea. Negative for blood in stool and vomiting.  Musculoskeletal:  Positive for myalgias.  Neurological:  Positive for headaches.    Observations/Objective:  Due to the nature of the virtual visit, physical exam  and observations are limited. Able to obtain the following observations:   Alert, oriented, x3 Appears comfortable, in no acute distress.  No scleral injection, no appreciated hoarseness, tachypnea, wheeze or strider. Able to maintain conversation without visible strain.  No cough appreciated during visit.   Assessment and Plan:   Problem List Items Addressed This Visit   None Visit Diagnoses     Gastroenteritis    -  Primary Acute, new concern Patient reports nausea, diarrhea, myalgias and elevated temperatures since Sunday that are not improving with Ibuprofen Suspect infectious gastritis/ gastroenteritis at this time Recommend increasing hydration efforts, eating small bland meals, alternating Tylenol and Ibuprofen as needed Will send script for Zofran to aid with nausea  She can also use Pepto and/or Pepcid to assist with GI symptoms PRN  Reviewed ED and return precautions Follow up as needed for persistent or progressing symptoms     Nausea       Relevant Medications   ondansetron (ZOFRAN) 4 MG tablet   Diarrhea of presumed infectious origin       Relevant Medications   ondansetron (ZOFRAN) 4 MG tablet      Follow Up Instructions:    I discussed the assessment and treatment plan with the patient. The patient was provided an opportunity to ask questions and all were answered. The patient agreed with the plan and demonstrated an understanding of the instructions.   The patient was advised to call back or seek an in-person evaluation if the symptoms  worsen or if the condition fails to improve as anticipated.  I provided 16 minutes of non-face-to-face time during this encounter.  No follow-ups on file.   I, Pavielle Biggar E [facility], PA-C, have reviewed all documentation for this visit. The documentation on 05/30/23 for the exam, diagnosis, procedures, and orders are all accurate and complete.   Jacquelin Hawking, MHS, PA-C [facility]

## 2023-06-03 ENCOUNTER — Ambulatory Visit
Admission: EM | Admit: 2023-06-03 | Discharge: 2023-06-03 | Disposition: A | Discharge status: Home / Self Care | Payer: BC Managed Care – PPO | Attending: Emergency Medicine | Admitting: Emergency Medicine

## 2023-06-03 DIAGNOSIS — R1032 Left lower quadrant pain: Secondary | ICD-10-CM

## 2023-06-03 DIAGNOSIS — Z3202 Encounter for pregnancy test, result negative: Secondary | ICD-10-CM

## 2023-06-03 LAB — POCT URINE PREGNANCY: Preg Test, Ur: NEGATIVE

## 2023-06-03 LAB — POCT URINALYSIS DIP (MANUAL ENTRY)
Bilirubin, UA: NEGATIVE
Glucose, UA: NEGATIVE mg/dL
Ketones, POC UA: NEGATIVE mg/dL
Leukocytes, UA: NEGATIVE
Nitrite, UA: NEGATIVE
Protein Ur, POC: NEGATIVE mg/dL
Spec Grav, UA: 1.01 (ref 1.010–1.025)
Urobilinogen, UA: 0.2 E.U./dL
pH, UA: 6.5 (ref 5.0–8.0)

## 2023-06-03 NOTE — ED Triage Notes (Addendum)
Patient to Urgent Care with complaints of left lower pelvic pain. At times pain radiates into her lower back. Describes pain as aching/ cramping- similar to menstrual cramping. Denies any hx of the same. Area tender to the touch.  Symptoms started approx 16 hours ago. Reports having a GI bug this week and has had a lot of gas discomfort. No diarrhea since Wednesday. No vomiting or nausea. Eating a bland diet.

## 2023-06-03 NOTE — ED Provider Notes (Signed)
[name]    CSN: [id number] Arrival date & time: 06/03/23  8295      History   Chief Complaint Chief Complaint  Patient presents with   Abdominal Pain    HPI Laura Russo is a 37 y.o. female.  Patient presents with left lower abdominal pain since yesterday.  The pain is persistent and at times radiates to her lower back.  Earlier in the week she had a gastroenteritis with nausea and diarrhea but the symptoms resolved 3 days ago.  Since the gastroenteritis, she has had a normal bowel movement.  She denies fever, chills, dysuria, hematuria, vomiting, diarrhea, constipation, blood in her stool, vaginal discharge, pelvic pain, or other symptoms.  She has been treating the pain with a bland diet.  Patient had a video visit with her PCP on 05/30/2023; diagnosed with gastroenteritis, nausea, diarrhea; treated with Zofran; She reports she did not take the Zofran.  She denies recent travel out of the country.  The history is provided by the patient and medical records.    Past Medical History:  Diagnosis Date   Depression     Patient Active Problem List   Diagnosis Date Noted   Tick bite 06/24/2022   Fatigue 06/24/2022   Asbestos exposure 12/09/2021   COVID-19 09/14/2021   Depression, recurrent (HCC) 06/29/2021   Nonintractable headache 06/29/2021   Pain of right shoulder joint on movement 05/25/2021   Bicipital tendonitis of right shoulder 05/25/2021    Past Surgical History:  Procedure Laterality Date   WISDOM TOOTH EXTRACTION      OB History   No obstetric history on file.      Home Medications    Prior to Admission medications   Medication Sig Start Date End Date Taking? Authorizing Provider  fluticasone (FLONASE) 50 MCG/ACT nasal spray  07/06/21   [provider]  ibuprofen (ADVIL) 200 MG tablet Take 200 mg by mouth every 6 (six) hours as needed.    [provider]  levonorgestrel (MIRENA, 52 MG,) 20 MCG/DAY IUD Take 1 device  by intrauterine route. 07/21/16   [provider]  LO LOESTRIN FE 1 MG-10 MCG / 10 MCG tablet Take 1 tablet by mouth daily. 06/15/21   [provider]  Loratadine 10 MG CAPS Take 1 capsule by mouth daily as needed.    [provider]  ondansetron (ZOFRAN) 4 MG tablet Take 1 tablet (4 mg total) by mouth every 8 (eight) hours as needed for nausea or vomiting. 05/30/23   Mecum, Erin E, PA-C  sertraline (ZOLOFT) 100 MG tablet TAKE 1 & 1/2 TABLET BY MOUTH EVERY MORNING AS DIRECTED    [provider]  Sertraline HCl 150 MG CAPS  02/20/23   [provider]    Family History Family History  Problem Relation Age of Onset   Depression Mother    Asthma Mother    Heart disease Father    Diverticulosis Father    Heart disease Paternal Uncle    Heart disease Maternal Grandmother    Dementia Maternal Grandmother    Dementia Maternal Grandfather    Colon cancer Paternal Grandmother     Social History Social History   Tobacco Use   Smoking status: Never   Smokeless tobacco: Never  Vaping Use   Vaping status: Never Used  Substance Use Topics   Alcohol use: Yes    Comment: on occasion   Drug use: Never     Allergies   Erythromycin base  Review of Systems Review of Systems  Constitutional:  Negative for chills and fever.  Respiratory:  Negative for cough and shortness of breath.   Cardiovascular:  Negative for chest pain and palpitations.  Gastrointestinal:  Positive for abdominal pain. Negative for blood in stool, constipation, diarrhea, nausea and vomiting.  Genitourinary:  Negative for dysuria, frequency, hematuria, pelvic pain and vaginal discharge.  Skin:  Negative for color change and rash.     Physical Exam Triage Vital Signs ED Triage Vitals  Encounter Vitals Group     BP      Systolic BP Percentile      Diastolic BP Percentile      Pulse      Resp      Temp      Temp src      SpO2      Weight      Height      Head  Circumference      Peak Flow      Pain Score      Pain Loc      Pain Education      Exclude from Growth Chart    No data found.  Updated Vital Signs BP (!) 132/91   Pulse 91   Temp 98.9 F (37.2 C)   Resp 18   SpO2 99%   Visual Acuity Right Eye Distance:   Left Eye Distance:   Bilateral Distance:    Right Eye Near:   Left Eye Near:    Bilateral Near:     Physical Exam Constitutional:      General: She is not in acute distress. HENT:     Mouth/Throat:     Mouth: Mucous membranes are moist.  Cardiovascular:     Rate and Rhythm: Normal rate and regular rhythm.     Heart sounds: Normal heart sounds.  Pulmonary:     Effort: Pulmonary effort is normal. No respiratory distress.     Breath sounds: Normal breath sounds.  Abdominal:     General: Bowel sounds are normal.     Palpations: Abdomen is soft.     Tenderness: There is abdominal tenderness in the left lower quadrant. There is no right CVA tenderness, left CVA tenderness, guarding or rebound.  Skin:    General: Skin is warm and dry.  Neurological:     Mental Status: She is alert.  Psychiatric:        Mood and Affect: Mood normal.        Behavior: Behavior normal.      UC Treatments / Results  Labs (all labs ordered are listed, but only abnormal results are displayed) Labs Reviewed  POCT URINALYSIS DIP (MANUAL ENTRY) - Abnormal; Notable for the following components:      Result Value   Blood, UA trace-intact (*)    All other components within normal limits  POCT URINE PREGNANCY    EKG   Radiology No results found.  Procedures Procedures (including critical care time)  Medications Ordered in UC Medications - No data to display  Initial Impression / Assessment and Plan / UC Course  I have reviewed the triage vital signs and the nursing notes.  Pertinent labs & imaging results that were available during my care of the patient were reviewed by me and considered in my medical decision making (see  chart for details).    LLQ abdominal pain, Negative pregnancy test.  Urine negative for infection.  Afebrile and vital signs are stable.  Patient  is tender in her left lower quadrant.  No rebound or guarding.  She declines transfer to the ED.  Instructed her to follow-up with her PCP on Monday and ED precautions discussed.  Education provided on abdominal pain.  She agrees to plan of care.  Final Clinical Impressions(s) / UC Diagnoses   Final diagnoses:  Left lower quadrant abdominal pain  Negative pregnancy test     Discharge Instructions      Go to the emergency department if you have persistent or worsening symptoms.    Follow-up with your primary care provider on Monday.         ED Prescriptions   None    PDMP not reviewed this encounter.   Mickie Bail, NP 06/03/23 [phone]

## 2023-06-03 NOTE — Discharge Instructions (Addendum)
Go to the emergency department if you have persistent or worsening symptoms.  Follow-up with your primary care provider on Monday.

## 2023-06-04 DIAGNOSIS — F411 Generalized anxiety disorder: Secondary | ICD-10-CM | POA: Diagnosis not present

## 2023-06-05 ENCOUNTER — Ambulatory Visit: Payer: BC Managed Care – PPO | Admitting: Physician Assistant

## 2023-06-05 VITALS — BP 123/85 | HR 90 | Temp 98.0°F | Wt 163.6 lb

## 2023-06-05 DIAGNOSIS — R1032 Left lower quadrant pain: Secondary | ICD-10-CM | POA: Diagnosis not present

## 2023-06-05 NOTE — Assessment & Plan Note (Addendum)
Acute, new concern Patient reports persistent tenderness to LLQ after recovering from recent GI bug PE is notable for tenderness in LLQ and inguinal area - no hernia palpated today even with coughing  Patient denies fevers (temps ~98.0) and changes to stools, active bleeding at this time Differential includes but is not limited to: lingering abdominal pain from gastroenteritis, diverticulitis, ovarian cyst, ovarian torsion, appendicitis, constipation, nephrolithiasis  Will check CMP, CBC, CRP, ESR for rule out  Will also get CT abdomen for further evaluation Reviewed ED and return precautions Follow up as needed for persistent or progressing symptoms

## 2023-06-05 NOTE — Progress Notes (Signed)
Acute Office Visit   Patient: Laura Russo   DOB: 1986-07-26   37 y.o. Female  MRN: [id number] Visit Date: 06/05/2023  Today's healthcare provider: Oswaldo Conroy Stephaine Breshears, PA-C  Introduced myself to the patient as a Secondary school teacher and provided education on APPs in clinical practice.    Chief Complaint  Patient presents with   Abdominal Pain    LLQ abdominal pain following suspected stomach bug    Subjective    HPI HPI     Abdominal Pain    Additional comments: LLQ abdominal pain following suspected stomach bug       Last edited by Providence Crosby, PA-C on 06/05/2023  8:33 AM.       Abdominal Pain  She reports LLQ abdominal pain that has been lingering for the past 2 days  She went to UC on Sat morning but they did not have imaging - reviewed lab results and notes  She reports she is still having the pain today - usually occurs with palpation and pressure applied to the area If she eats and gets gassy or bloated that can also cause tenderness Pain level and character: 7/10 with pressure applied and feels achy and cramping - almost feels like gas but is not moving or resolving  She reports some radiation along her back and into her hips   She reports her bowel movements have gotten better and are returning to normal. She denies seeing any blood in her stools Unsure of LMP as she has Mirena and OCP  She denies nausea or vomiting outside of her initial stomach upset late last week    Medications: Outpatient Medications Prior to Visit  Medication Sig   fluticasone (FLONASE) 50 MCG/ACT nasal spray    ibuprofen (ADVIL) 200 MG tablet Take 200 mg by mouth every 6 (six) hours as needed.   levonorgestrel (MIRENA, 52 MG,) 20 MCG/DAY IUD Take 1 device by intrauterine route.   LO LOESTRIN FE 1 MG-10 MCG / 10 MCG tablet Take 1 tablet by mouth daily.   Loratadine 10 MG CAPS Take 1 capsule by mouth daily as needed.   ondansetron (ZOFRAN) 4 MG tablet Take 1 tablet (4 mg total) by mouth  every 8 (eight) hours as needed for nausea or vomiting.   sertraline (ZOLOFT) 100 MG tablet TAKE 1 & 1/2 TABLET BY MOUTH EVERY MORNING AS DIRECTED   Sertraline HCl 150 MG CAPS    No facility-administered medications prior to visit.    Review of Systems  Constitutional:  Negative for chills, fatigue and fever.  Gastrointestinal:  Positive for abdominal pain. Negative for abdominal distention, blood in stool, constipation, diarrhea, nausea and vomiting.  Neurological:  Negative for dizziness, light-headedness and headaches.        Objective    BP 123/85   Pulse 90   Temp 98 F (36.7 C) (Oral)   Wt 163 lb 9.6 oz (74.2 kg)   SpO2 98%   BMI 24.88 kg/m     Physical Exam Vitals reviewed.  Constitutional:      General: She is awake.     Appearance: Normal appearance. She is well-developed and well-groomed.  HENT:     Head: Normocephalic and atraumatic.  Pulmonary:     Effort: Pulmonary effort is normal.  Abdominal:     General: Abdomen is flat. Bowel sounds are normal.     Palpations: Abdomen is soft.     Tenderness: There is abdominal tenderness in  the left lower quadrant. There is guarding. There is no rebound. Negative signs include Murphy's sign and McBurney's sign.     Hernia: No hernia is present. There is no hernia in the umbilical area, ventral area, left inguinal area or left femoral area.  Neurological:     Mental Status: She is alert.  Psychiatric:        Behavior: Behavior is cooperative.       No results found for any visits on 06/05/23.  Assessment & Plan      No follow-ups on file.     Problem List Items Addressed This Visit       Other   Left lower quadrant abdominal pain - Primary    Acute, new concern Patient reports persistent tenderness to LLQ after recovering from recent GI bug PE is notable for tenderness in LLQ and inguinal area - no hernia palpated today even with coughing  Patient denies fevers (temps ~98.0) and changes to stools,  active bleeding at this time Differential includes but is not limited to: lingering abdominal pain from gastroenteritis, diverticulitis, ovarian cyst, ovarian torsion, appendicitis, constipation, nephrolithiasis  Will check CMP, CBC, CRP, ESR for rule out  Will also get CT abdomen for further evaluation Reviewed ED and return precautions Follow up as needed for persistent or progressing symptoms        Relevant Orders   CT ABDOMEN PELVIS WO CONTRAST   CBC w/Diff   Comp Met (CMET)   Sed Rate (ESR)   C-reactive protein     No follow-ups on file.   I, Stepfanie Yott E Archita Lomeli, PA-C, have reviewed all documentation for this visit. The documentation on 06/05/23 for the exam, diagnosis, procedures, and orders are all accurate and complete.   Jacquelin Hawking, MHS, PA-C [facility]

## 2023-06-06 LAB — COMPREHENSIVE METABOLIC PANEL
ALT: 39 IU/L — ABNORMAL HIGH (ref 0–32)
AST: 23 IU/L (ref 0–40)
Albumin: 4.3 g/dL (ref 3.9–4.9)
Alkaline Phosphatase: 85 IU/L (ref 44–121)
BUN/Creatinine Ratio: 11 (ref 9–23)
BUN: 9 mg/dL (ref 6–20)
Bilirubin Total: 0.3 mg/dL (ref 0.0–1.2)
CO2: 26 mmol/L (ref 20–29)
Calcium: 9.6 mg/dL (ref 8.7–10.2)
Chloride: 100 mmol/L (ref 96–106)
Creatinine, Ser: 0.81 mg/dL (ref 0.57–1.00)
Globulin, Total: 2.7 g/dL (ref 1.5–4.5)
Glucose: 83 mg/dL (ref 70–99)
Potassium: 4.5 mmol/L (ref 3.5–5.2)
Sodium: 139 mmol/L (ref 134–144)
Total Protein: 7 g/dL (ref 6.0–8.5)
eGFR: 95 mL/min/{1.73_m2} (ref >59)

## 2023-06-06 LAB — CBC WITH DIFFERENTIAL/PLATELET
Basophils Absolute: 0.1 10*3/uL (ref 0.0–0.2)
Basos: 1 %
EOS (ABSOLUTE): 0.1 10*3/uL (ref 0.0–0.4)
Eos: 1 %
Hematocrit: 42.7 % (ref 34.0–46.6)
Hemoglobin: 14.2 g/dL (ref 11.1–15.9)
Immature Grans (Abs): 0 10*3/uL (ref 0.0–0.1)
Immature Granulocytes: 0 %
Lymphocytes Absolute: 1.3 10*3/uL (ref 0.7–3.1)
Lymphs: 12 %
MCH: 30.1 pg (ref 26.6–33.0)
MCHC: 33.3 g/dL (ref 31.5–35.7)
MCV: 91 fL (ref 79–97)
Monocytes Absolute: 0.6 10*3/uL (ref 0.1–0.9)
Monocytes: 5 %
Neutrophils Absolute: 8.8 10*3/uL — ABNORMAL HIGH (ref 1.4–7.0)
Neutrophils: 81 %
Platelets: 367 10*3/uL (ref 150–450)
RBC: 4.72 x10E6/uL (ref 3.77–5.28)
RDW: 11.8 % (ref 11.7–15.4)
WBC: 10.9 10*3/uL — ABNORMAL HIGH (ref 3.4–10.8)

## 2023-06-06 LAB — C-REACTIVE PROTEIN: CRP: 20 mg/L — ABNORMAL HIGH (ref 0–10)

## 2023-06-06 LAB — SEDIMENTATION RATE: Sed Rate: 16 mm/h (ref 0–32)

## 2023-06-06 NOTE — Progress Notes (Signed)
Your white blood cells were slightly elevated as was your inflammatory marker, CRP These elevations were mild and could be due to your recent GI bug or due to something else. I think it would be appropriate to get the CT scan to see what may be causing the left sided pain.  For now I think you can continue with the measures we discussed - if you notice increased temperatures, changes to your bowel habits, increased abdominal pain, please let us know or seek prompt care at [facility] or ED.  Please let us know if you have any questions or concerns.

## 2023-06-08 ENCOUNTER — Encounter: Payer: Self-pay | Admitting: Physician Assistant

## 2023-06-08 DIAGNOSIS — F411 Generalized anxiety disorder: Secondary | ICD-10-CM | POA: Diagnosis not present

## 2023-06-09 ENCOUNTER — Ambulatory Visit: Payer: BC Managed Care – PPO

## 2023-06-09 NOTE — Telephone Encounter (Signed)
Copied from CRM [id number]. Topic: General - Other >> Jun 09, 2023  4:10 PM Franchot Heidelberg wrote: Reason for CRM: Pt called to report that she did get the imaging appt scheduled for Thursday with [facility]. Says she does not need a call back if Brayton Caves does not think its necessary.

## 2023-06-15 ENCOUNTER — Ambulatory Visit
Admission: RE | Admit: 2023-06-15 | Discharge: 2023-06-15 | Disposition: A | Discharge status: Home / Self Care | Payer: BC Managed Care – PPO | Source: Ambulatory Visit | Attending: Physician Assistant | Admitting: Physician Assistant

## 2023-06-15 DIAGNOSIS — Z630 Problems in relationship with spouse or partner: Secondary | ICD-10-CM | POA: Diagnosis not present

## 2023-06-15 DIAGNOSIS — R1032 Left lower quadrant pain: Secondary | ICD-10-CM

## 2023-06-15 DIAGNOSIS — F4010 Social phobia, unspecified: Secondary | ICD-10-CM | POA: Diagnosis not present

## 2023-06-15 DIAGNOSIS — Z563 Stressful work schedule: Secondary | ICD-10-CM | POA: Diagnosis not present

## 2023-06-15 DIAGNOSIS — F322 Major depressive disorder, single episode, severe without psychotic features: Secondary | ICD-10-CM | POA: Diagnosis not present

## 2023-06-16 ENCOUNTER — Telehealth: Payer: Self-pay | Admitting: Family Medicine

## 2023-06-16 ENCOUNTER — Ambulatory Visit: Payer: BC Managed Care – PPO

## 2023-06-16 NOTE — Telephone Encounter (Signed)
Copied from CRM [id number]. Topic: General - Other >> Jun 16, 2023  3:45 PM Ja-Kwan M wrote: Reason for CRM: Pt requests call back to go over imaging results. Cb# [phone]

## 2023-06-19 NOTE — Telephone Encounter (Signed)
Attempted to reach patient, LVM for patient to call office back to go over imaging results.  Put in CRM.

## 2023-06-20 DIAGNOSIS — F411 Generalized anxiety disorder: Secondary | ICD-10-CM | POA: Diagnosis not present

## 2023-06-21 NOTE — Progress Notes (Signed)
Your CT scan did not show any concerning findings in your abdomen. At this time you should be able to return to normal activities as desired. Please let us know if you are still having pain or if you have further concerns.

## 2023-06-22 ENCOUNTER — Encounter: Payer: Self-pay | Admitting: Physician Assistant

## 2023-06-22 ENCOUNTER — Telehealth (INDEPENDENT_AMBULATORY_CARE_PROVIDER_SITE_OTHER): Payer: BC Managed Care – PPO | Admitting: Physician Assistant

## 2023-06-22 DIAGNOSIS — R109 Unspecified abdominal pain: Secondary | ICD-10-CM | POA: Diagnosis not present

## 2023-06-22 DIAGNOSIS — Z712 Person consulting for explanation of examination or test findings: Secondary | ICD-10-CM

## 2023-06-22 NOTE — Progress Notes (Signed)
     Virtual Visit via Video Note  I connected with Laura Russo on 06/22/23 at 11:00 AM EDT by a video enabled telemedicine application and verified that I am speaking with the correct person using two identifiers.  Today's Provider: Jacquelin Hawking, MHS, PA-C Introduced myself to the patient as a PA-C and provided education on APPs in clinical practice.   Location: Patient: at home  Provider: [facility]    I discussed the limitations of evaluation and management by telemedicine and the availability of in person appointments. The patient expressed understanding and agreed to proceed.   Chief Complaint  Patient presents with   Abdominal Pain    Patient says everything is going OK, and says the pain has gradually tapered off. Patient says she still has some days where she feels the pain comes back.     History of Present Illness:    Abdominal pain   Reports pain is only there when poked and has tapered off  States she was pain free for several days prior to the CT scan but is still shocked that there weren't any findings    Observations/Objective:  Due to the nature of the virtual visit, physical exam and observations are limited. Able to obtain the following observations:   Alert, oriented, x3  Appears comfortable, in no acute distress.  No scleral injection, no appreciated hoarseness, tachypnea, wheeze or strider. Able to maintain conversation without visible strain.  No cough appreciated during visit.    Assessment and Plan:  Problem List Items Addressed This Visit   None Visit Diagnoses     Encounter to discuss test results    -  Primary Patient had CT abdomen/pelvis completed 06/15/23 for LLQ abdominal pain We reviewed the imaging  results and potential etiologies of her pain discussed at her visits on 05/30/23 and 06/05/23  She reports pain has largely resolved but there is some tenderness to palpation that is lingering CT scan was normal  -suspect resolution of inciting event at this time but will continue to monitor Follow up as needed for persistent or progressing symptoms        Follow Up Instructions:    I discussed the assessment and treatment plan with the patient. The patient was provided an opportunity to ask questions and all were answered. The patient agreed with the plan and demonstrated an understanding of the instructions.   The patient was advised to call back or seek an in-person evaluation if the symptoms worsen or if the condition fails to improve as anticipated.  I provided 10 minutes of non-face-to-face time during this encounter.  No follow-ups on file.   I, Arabela Basaldua E Beau Ramsburg, PA-C, have reviewed all documentation for this visit. The documentation on 06/22/23 for the exam, diagnosis, procedures, and orders are all accurate and complete.   Jacquelin Hawking, MHS, PA-C [facility]

## 2023-07-24 DIAGNOSIS — F411 Generalized anxiety disorder: Secondary | ICD-10-CM | POA: Diagnosis not present

## 2023-08-18 DIAGNOSIS — F411 Generalized anxiety disorder: Secondary | ICD-10-CM | POA: Diagnosis not present

## 2023-09-18 ENCOUNTER — Encounter: Payer: Self-pay | Admitting: Family Medicine

## 2023-09-18 ENCOUNTER — Ambulatory Visit (INDEPENDENT_AMBULATORY_CARE_PROVIDER_SITE_OTHER): Payer: BC Managed Care – PPO | Admitting: Family Medicine

## 2023-09-18 VITALS — BP 113/79 | HR 80 | Ht 68.0 in | Wt 169.8 lb

## 2023-09-18 DIAGNOSIS — F339 Major depressive disorder, recurrent, unspecified: Secondary | ICD-10-CM | POA: Diagnosis not present

## 2023-09-18 DIAGNOSIS — Z0000 Encounter for general adult medical examination without abnormal findings: Secondary | ICD-10-CM | POA: Diagnosis not present

## 2023-09-18 DIAGNOSIS — Z1231 Encounter for screening mammogram for malignant neoplasm of breast: Secondary | ICD-10-CM

## 2023-09-18 NOTE — Patient Instructions (Signed)
Please call to schedule your mammogram and/or bone density: [facility]  Address: [address], [place], [place] [address] Phone: [phone]  [facility] [place] [address] [place],  [place]  [address] Phone: [phone]   

## 2023-09-18 NOTE — Assessment & Plan Note (Signed)
On sertraline. Doing well. No concerns.

## 2023-09-18 NOTE — Progress Notes (Signed)
BP 113/79   Pulse 80   Ht 5\' 8"  (1.727 m)   Wt 169 lb 12.8 oz (77 kg)   SpO2 98%   BMI 25.82 kg/m    Subjective:    Patient ID: Laura Russo, female    DOB: 12/31/1985, 37 y.o.   MRN: [id number]  HPI: Laura Russo is a 37 y.o. female presenting on 09/18/2023 for comprehensive medical examination. Current medical complaints include:none  Menopausal Symptoms: no  Depression Screen done today and results listed below:     09/18/2023   10:05 AM 06/05/2023    8:44 AM 09/16/2022    8:18 AM 08/11/2022    2:53 PM 06/24/2022   10:05 AM  Depression screen PHQ 2/9  Decreased Interest 0 1 1 2 2   Down, Depressed, Hopeless 0 1 1 1 2   PHQ - 2 Score 0 2 2 3 4   Altered sleeping 1 1 0 3 2  Tired, decreased energy 2 1 1 3 2   Change in appetite 0 1 0 1 0  Feeling bad or failure about yourself  0 0 0 0 1  Trouble concentrating 1 1 1 1 1   Moving slowly or fidgety/restless 0 0 0 0 0  Suicidal thoughts 0 0 0 0 0  PHQ-9 Score 4 6 4 11 10   Difficult doing work/chores Somewhat difficult Somewhat difficult Somewhat difficult Somewhat difficult Somewhat difficult    Past Medical History:  Past Medical History:  Diagnosis Date   Depression     Surgical History:  Past Surgical History:  Procedure Laterality Date   WISDOM TOOTH EXTRACTION      Medications:  Current Outpatient Medications on File Prior to Visit  Medication Sig   fluticasone (FLONASE) 50 MCG/ACT nasal spray    ibuprofen (ADVIL) 200 MG tablet Take 200 mg by mouth every 6 (six) hours as needed.   levonorgestrel (MIRENA, 52 MG,) 20 MCG/DAY IUD Take 1 device by intrauterine route.   LO LOESTRIN FE 1 MG-10 MCG / 10 MCG tablet Take 1 tablet by mouth daily.   Loratadine 10 MG CAPS Take 1 capsule by mouth daily as needed.   sertraline (ZOLOFT) 100 MG tablet TAKE 1 & 1/2 TABLET BY MOUTH EVERY MORNING AS DIRECTED   No current facility-administered medications on file prior to visit.    Allergies:  Allergies   Allergen Reactions   Erythromycin Base Rash    Social History:  Social History   Socioeconomic History   Marital status: Media planner    Spouse name: Not on file   Number of children: Not on file   Years of education: Not on file   Highest education level: Bachelor's degree (e.g., BA, AB, BS)  Occupational History   Not on file  Tobacco Use   Smoking status: Never   Smokeless tobacco: Never  Vaping Use   Vaping status: Never Used  Substance and Sexual Activity   Alcohol use: Yes    Comment: on occasion   Drug use: Never   Sexual activity: Yes  Other Topics Concern   Not on file  Social History Narrative   Not on file   Social Determinants of Health   Financial Resource Strain: Low Risk  (06/05/2023)   Overall Financial Resource Strain (CARDIA)    Difficulty of Paying Living Expenses: Not hard at all  Food Insecurity: No Food Insecurity (06/05/2023)   Hunger Vital Sign    Worried About Running Out of Food in the Last Year: Never true  Ran Out of Food in the Last Year: Never true  Transportation Needs: No Transportation Needs (06/05/2023)   PRAPARE - Administrator, Civil Service (Medical): No    Lack of Transportation (Non-Medical): No  Physical Activity: Sufficiently Active (06/05/2023)   Exercise Vital Sign    Days of Exercise per Week: 5 days    Minutes of Exercise per Session: 50 min  Stress: Stress Concern Present (06/05/2023)   [employer] of Occupational Health - Occupational Stress Questionnaire    Feeling of Stress : To some extent  Social Connections: Unknown (06/05/2023)   Social Connection and Isolation Panel [NHANES]    Frequency of Communication with Friends and Family: Twice a week    Frequency of Social Gatherings with Friends and Family: Twice a week    Attends Religious Services: Never    Database administrator or Organizations: Yes    Attends Engineer, structural: More than 4 times per year    Marital Status:  Patient declined  Catering manager Violence: Not on file   Social History   Tobacco Use  Smoking Status Never  Smokeless Tobacco Never   Social History   Substance and Sexual Activity  Alcohol Use Yes   Comment: on occasion    Family History:  Family History  Problem Relation Age of Onset   Depression Mother    Asthma Mother    Heart disease Father    Diverticulosis Father    Prostate cancer Father    Heart disease Maternal Grandmother    Dementia Maternal Grandmother    Dementia Maternal Grandfather    Colon cancer Paternal Grandmother    Heart disease Paternal Uncle     Past medical history, surgical history, medications, allergies, family history and social history reviewed with patient today and changes made to appropriate areas of the chart.   Review of Systems  Constitutional:  Positive for diaphoresis (with zoloft at night). Negative for chills, fever, malaise/fatigue and weight loss.  HENT: Negative.    Eyes: Negative.        Pressure in her eyes with tension headaches   Respiratory: Negative.    Cardiovascular: Negative.   Gastrointestinal:  Positive for abdominal pain and blood in stool (anal fissure about 2 weeks ago). Negative for constipation, diarrhea, heartburn, melena, nausea and vomiting.       BM more often and smaller amounts  Genitourinary: Negative.   Musculoskeletal: Negative.   Skin:  Positive for itching (on her head). Negative for rash.  Neurological:  Positive for tingling (with postions). Negative for dizziness, tremors, sensory change, speech change, focal weakness, seizures, loss of consciousness, weakness and headaches.  Endo/Heme/Allergies:  Positive for environmental allergies. Negative for polydipsia. Does not bruise/bleed easily.  Psychiatric/Behavioral: Negative.     All other ROS negative except what is listed above and in the HPI.      Objective:    BP 113/79   Pulse 80   Ht 5\' 8"  (1.727 m)   Wt 169 lb 12.8 oz (77 kg)    SpO2 98%   BMI 25.82 kg/m   Wt Readings from Last 3 Encounters:  09/18/23 169 lb 12.8 oz (77 kg)  06/05/23 163 lb 9.6 oz (74.2 kg)  09/16/22 166 lb 6.4 oz (75.5 kg)    Physical Exam Vitals and nursing note reviewed.  Constitutional:      General: She is not in acute distress.    Appearance: Normal appearance. She is not ill-appearing, toxic-appearing or  diaphoretic.  HENT:     Head: Normocephalic and atraumatic.     Right Ear: Tympanic membrane, ear canal and external ear normal. There is no impacted cerumen.     Left Ear: Tympanic membrane, ear canal and external ear normal. There is no impacted cerumen.     Nose: Nose normal. No congestion or rhinorrhea.     Mouth/Throat:     Mouth: Mucous membranes are moist.     Pharynx: Oropharynx is clear. No oropharyngeal exudate or posterior oropharyngeal erythema.  Eyes:     General: No scleral icterus.       Right eye: No discharge.        Left eye: No discharge.     Extraocular Movements: Extraocular movements intact.     Conjunctiva/sclera: Conjunctivae normal.     Pupils: Pupils are equal, round, and reactive to light.  Neck:     Vascular: No carotid bruit.  Cardiovascular:     Rate and Rhythm: Normal rate and regular rhythm.     Pulses: Normal pulses.     Heart sounds: No murmur heard.    No friction rub. No gallop.  Pulmonary:     Effort: Pulmonary effort is normal. No respiratory distress.     Breath sounds: Normal breath sounds. No stridor. No wheezing, rhonchi or rales.  Chest:     Chest wall: No tenderness.  Abdominal:     General: Abdomen is flat. Bowel sounds are normal. There is no distension.     Palpations: Abdomen is soft. There is no mass.     Tenderness: There is no abdominal tenderness. There is no right CVA tenderness, left CVA tenderness, guarding or rebound.     Hernia: No hernia is present.  Genitourinary:    Comments: Breast and pelvic exams deferred with shared decision making Musculoskeletal:         General: No swelling, tenderness, deformity or signs of injury.     Cervical back: Normal range of motion and neck supple. No rigidity. No muscular tenderness.     Right lower leg: No edema.     Left lower leg: No edema.  Lymphadenopathy:     Cervical: No cervical adenopathy.  Skin:    General: Skin is warm and dry.     Capillary Refill: Capillary refill takes less than 2 seconds.     Coloration: Skin is not jaundiced or pale.     Findings: No bruising, erythema, lesion or rash.  Neurological:     General: No focal deficit present.     Mental Status: She is alert and oriented to person, place, and time. Mental status is at baseline.     Cranial Nerves: No cranial nerve deficit.     Sensory: No sensory deficit.     Motor: No weakness.     Coordination: Coordination normal.     Gait: Gait normal.     Deep Tendon Reflexes: Reflexes normal.  Psychiatric:        Mood and Affect: Mood normal.        Behavior: Behavior normal.        Thought Content: Thought content normal.        Judgment: Judgment normal.     Results for orders placed or performed in visit on 06/05/23  CBC w/Diff  Result Value Ref Range   WBC 10.9 (H) 3.4 - 10.8 x10E3/uL   RBC 4.72 3.77 - 5.28 x10E6/uL   Hemoglobin 14.2 11.1 - 15.9 g/dL   Hematocrit 42.7  34.0 - 46.6 %   MCV 91 79 - 97 fL   MCH 30.1 26.6 - 33.0 pg   MCHC 33.3 31.5 - 35.7 g/dL   RDW 44.0 10.2 - 72.5 %   Platelets 367 150 - 450 x10E3/uL   Neutrophils 81 Not Estab. %   Lymphs 12 Not Estab. %   Monocytes 5 Not Estab. %   Eos 1 Not Estab. %   Basos 1 Not Estab. %   Neutrophils Absolute 8.8 (H) 1.4 - 7.0 x10E3/uL   Lymphocytes Absolute 1.3 0.7 - 3.1 x10E3/uL   Monocytes Absolute 0.6 0.1 - 0.9 x10E3/uL   EOS (ABSOLUTE) 0.1 0.0 - 0.4 x10E3/uL   Basophils Absolute 0.1 0.0 - 0.2 x10E3/uL   Immature Granulocytes 0 Not Estab. %   Immature Grans (Abs) 0.0 0.0 - 0.1 x10E3/uL  Comp Met (CMET)  Result Value Ref Range   Glucose 83 70 - 99 mg/dL   BUN  9 6 - 20 mg/dL   Creatinine, Ser 3.66 0.57 - 1.00 mg/dL   eGFR 95 >44 IH/KVQ/2.59   BUN/Creatinine Ratio 11 9 - 23   Sodium 139 134 - 144 mmol/L   Potassium 4.5 3.5 - 5.2 mmol/L   Chloride 100 96 - 106 mmol/L   CO2 26 20 - 29 mmol/L   Calcium 9.6 8.7 - 10.2 mg/dL   Total Protein 7.0 6.0 - 8.5 g/dL   Albumin 4.3 3.9 - 4.9 g/dL   Globulin, Total 2.7 1.5 - 4.5 g/dL   Bilirubin Total 0.3 0.0 - 1.2 mg/dL   Alkaline Phosphatase 85 44 - 121 IU/L   AST 23 0 - 40 IU/L   ALT 39 (H) 0 - 32 IU/L  Sed Rate (ESR)  Result Value Ref Range   Sed Rate 16 0 - 32 mm/hr  C-reactive protein  Result Value Ref Range   CRP 20 (H) 0 - 10 mg/L      Assessment & Plan:   Problem List Items Addressed This Visit       Other   Depression, recurrent (HCC)    On sertraline. Doing well. No concerns.       Other Visit Diagnoses     Routine general medical examination at a health care facility    -  Primary   Vaccines up to date. Screening labs checked today. Pap through GYN. Mammogram ordered. Continue diet and exercise. Call with any concerns.   Relevant Orders   CBC with Differential/Platelet   Comprehensive metabolic panel   Lipid Panel w/o Chol/HDL Ratio   TSH   Encounter for screening mammogram for malignant neoplasm of breast       Due in May. Ordered today.   Relevant Orders   MM 3D SCREENING MAMMOGRAM BILATERAL BREAST        Follow up plan: Return in about 1 year (around 09/17/2024) for physical.   LABORATORY TESTING:  - Pap smear: done elsewhere  IMMUNIZATIONS:   - Tdap: Tetanus vaccination status reviewed: last tetanus booster within 10 years. - Influenza: Up to date - Pneumovax: Not applicable - Prevnar: Not applicable - COVID: Up to date - HPV: Not applicable - Shingrix vaccine: Not applicable  SCREENING: -Mammogram: Ordered today  - Colonoscopy: Not applicable   PATIENT COUNSELING:   Advised to take 1 mg of folate supplement per day if capable of pregnancy.    Sexuality: Discussed sexually transmitted diseases, partner selection, use of condoms, avoidance of unintended pregnancy  and contraceptive alternatives.   Advised  to avoid cigarette smoking.  I discussed with the patient that most people either abstain from alcohol or drink within safe limits (<=14/week and <=4 drinks/occasion for males, <=7/weeks and <= 3 drinks/occasion for females) and that the risk for alcohol disorders and other health effects rises proportionally with the number of drinks per week and how often a drinker exceeds daily limits.  Discussed cessation/primary prevention of drug use and availability of treatment for abuse.   Diet: Encouraged to adjust caloric intake to maintain  or achieve ideal body weight, to reduce intake of dietary saturated fat and total fat, to limit sodium intake by avoiding high sodium foods and not adding table salt, and to maintain adequate dietary potassium and calcium preferably from fresh fruits, vegetables, and low-fat dairy products.    stressed the importance of regular exercise  Injury prevention: Discussed safety belts, safety helmets, smoke detector, smoking near bedding or upholstery.   Dental health: Discussed importance of regular tooth brushing, flossing, and dental visits.    NEXT PREVENTATIVE PHYSICAL DUE IN 1 YEAR. Return in about 1 year (around 09/17/2024) for physical.

## 2023-09-19 LAB — COMPREHENSIVE METABOLIC PANEL
ALT: 21 [IU]/L (ref 0–32)
AST: 23 [IU]/L (ref 0–40)
Albumin: 4.4 g/dL (ref 3.9–4.9)
Alkaline Phosphatase: 74 [IU]/L (ref 44–121)
BUN/Creatinine Ratio: 15 (ref 9–23)
BUN: 13 mg/dL (ref 6–20)
Bilirubin Total: 0.3 mg/dL (ref 0.0–1.2)
CO2: 22 mmol/L (ref 20–29)
Calcium: 9.8 mg/dL (ref 8.7–10.2)
Chloride: 103 mmol/L (ref 96–106)
Creatinine, Ser: 0.87 mg/dL (ref 0.57–1.00)
Globulin, Total: 2.5 g/dL (ref 1.5–4.5)
Glucose: 74 mg/dL (ref 70–99)
Potassium: 4.6 mmol/L (ref 3.5–5.2)
Sodium: 140 mmol/L (ref 134–144)
Total Protein: 6.9 g/dL (ref 6.0–8.5)
eGFR: 87 mL/min/{1.73_m2} (ref >59)

## 2023-09-19 LAB — LIPID PANEL W/O CHOL/HDL RATIO
Cholesterol, Total: 214 mg/dL — ABNORMAL HIGH (ref 100–199)
HDL: 74 mg/dL (ref >39)
LDL Chol Calc (NIH): 120 mg/dL — ABNORMAL HIGH (ref 0–99)
Triglycerides: 112 mg/dL (ref 0–149)
VLDL Cholesterol Cal: 20 mg/dL (ref 5–40)

## 2023-09-19 LAB — CBC WITH DIFFERENTIAL/PLATELET
Basophils Absolute: 0 10*3/uL (ref 0.0–0.2)
Basos: 1 %
EOS (ABSOLUTE): 0.2 10*3/uL (ref 0.0–0.4)
Eos: 2 %
Hematocrit: 44 % (ref 34.0–46.6)
Hemoglobin: 14.5 g/dL (ref 11.1–15.9)
Immature Grans (Abs): 0 10*3/uL (ref 0.0–0.1)
Immature Granulocytes: 0 %
Lymphocytes Absolute: 1.3 10*3/uL (ref 0.7–3.1)
Lymphs: 22 %
MCH: 30.1 pg (ref 26.6–33.0)
MCHC: 33 g/dL (ref 31.5–35.7)
MCV: 92 fL (ref 79–97)
Monocytes Absolute: 0.5 10*3/uL (ref 0.1–0.9)
Monocytes: 9 %
Neutrophils Absolute: 4.2 10*3/uL (ref 1.4–7.0)
Neutrophils: 66 %
Platelets: 350 10*3/uL (ref 150–450)
RBC: 4.81 x10E6/uL (ref 3.77–5.28)
RDW: 12.3 % (ref 11.7–15.4)
WBC: 6.2 10*3/uL (ref 3.4–10.8)

## 2023-09-19 LAB — TSH: TSH: 1.73 u[IU]/mL (ref 0.450–4.500)

## 2023-09-27 DIAGNOSIS — X32XXXA Exposure to sunlight, initial encounter: Secondary | ICD-10-CM | POA: Diagnosis not present

## 2023-09-27 DIAGNOSIS — L814 Other melanin hyperpigmentation: Secondary | ICD-10-CM | POA: Diagnosis not present

## 2023-09-27 DIAGNOSIS — L570 Actinic keratosis: Secondary | ICD-10-CM | POA: Diagnosis not present

## 2023-09-27 DIAGNOSIS — D225 Melanocytic nevi of trunk: Secondary | ICD-10-CM | POA: Diagnosis not present

## 2023-09-27 DIAGNOSIS — L821 Other seborrheic keratosis: Secondary | ICD-10-CM | POA: Diagnosis not present

## 2023-09-27 DIAGNOSIS — I788 Other diseases of capillaries: Secondary | ICD-10-CM | POA: Diagnosis not present

## 2023-09-27 DIAGNOSIS — L578 Other skin changes due to chronic exposure to nonionizing radiation: Secondary | ICD-10-CM | POA: Diagnosis not present

## 2023-10-24 DIAGNOSIS — F322 Major depressive disorder, single episode, severe without psychotic features: Secondary | ICD-10-CM | POA: Diagnosis not present

## 2023-10-24 DIAGNOSIS — Z563 Stressful work schedule: Secondary | ICD-10-CM | POA: Diagnosis not present

## 2023-10-24 DIAGNOSIS — F4010 Social phobia, unspecified: Secondary | ICD-10-CM | POA: Diagnosis not present

## 2023-10-24 DIAGNOSIS — Z630 Problems in relationship with spouse or partner: Secondary | ICD-10-CM | POA: Diagnosis not present

## 2023-11-23 DIAGNOSIS — F411 Generalized anxiety disorder: Secondary | ICD-10-CM | POA: Diagnosis not present

## 2024-01-09 DIAGNOSIS — Z563 Stressful work schedule: Secondary | ICD-10-CM | POA: Diagnosis not present

## 2024-01-09 DIAGNOSIS — Z630 Problems in relationship with spouse or partner: Secondary | ICD-10-CM | POA: Diagnosis not present

## 2024-01-09 DIAGNOSIS — F4010 Social phobia, unspecified: Secondary | ICD-10-CM | POA: Diagnosis not present

## 2024-01-09 DIAGNOSIS — F322 Major depressive disorder, single episode, severe without psychotic features: Secondary | ICD-10-CM | POA: Diagnosis not present

## 2024-02-02 IMAGING — CR DG CHEST 2V
1 series · 2 of 2 positions shown · non-contrast
Comparison: None.

CLINICAL DATA: Asbestos exposure

EXAM:
CHEST - 2 VIEW

[Series 1: w chest pa · 0.14mm/px · 2 of 2 slices shown]
[im 1/2]
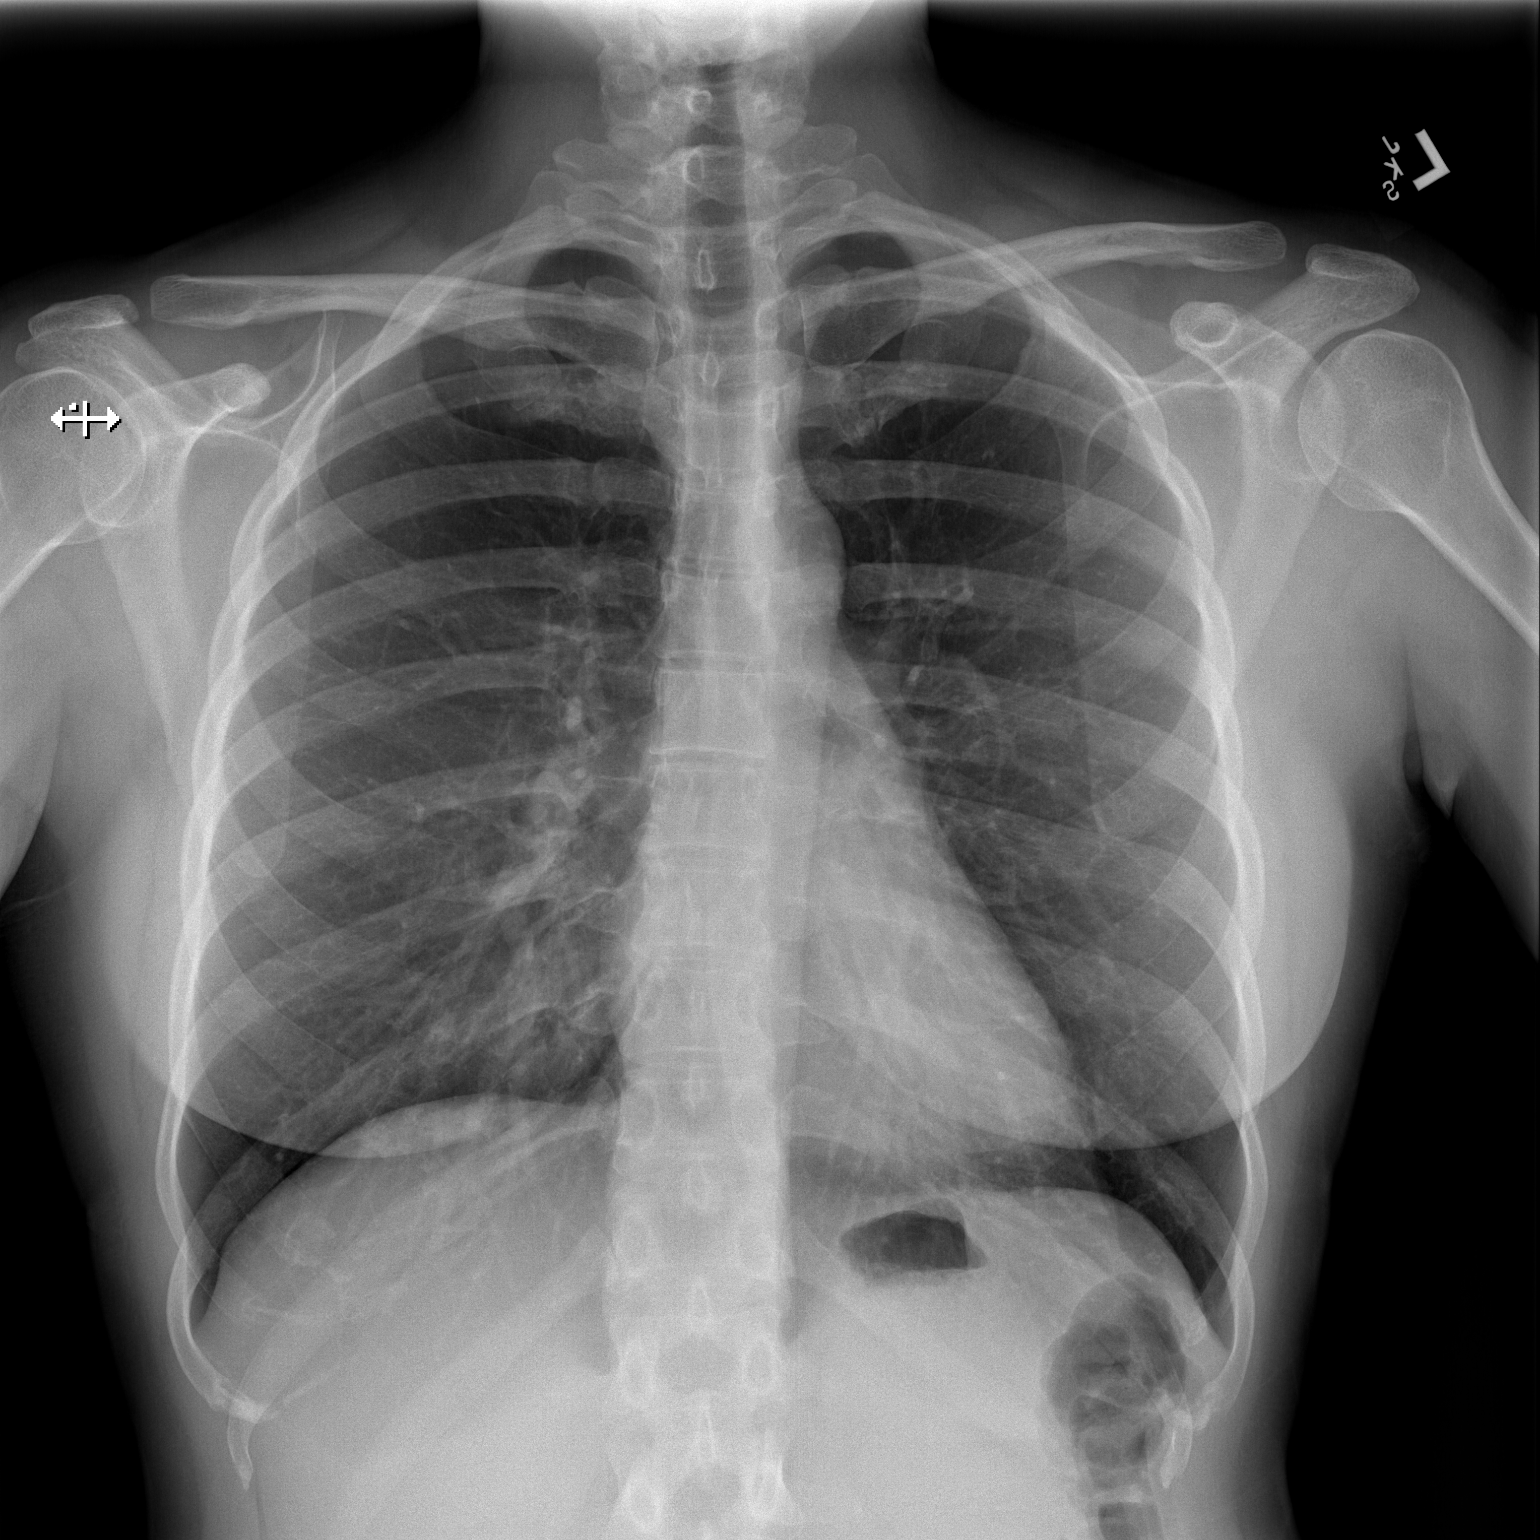
[im 2/2]
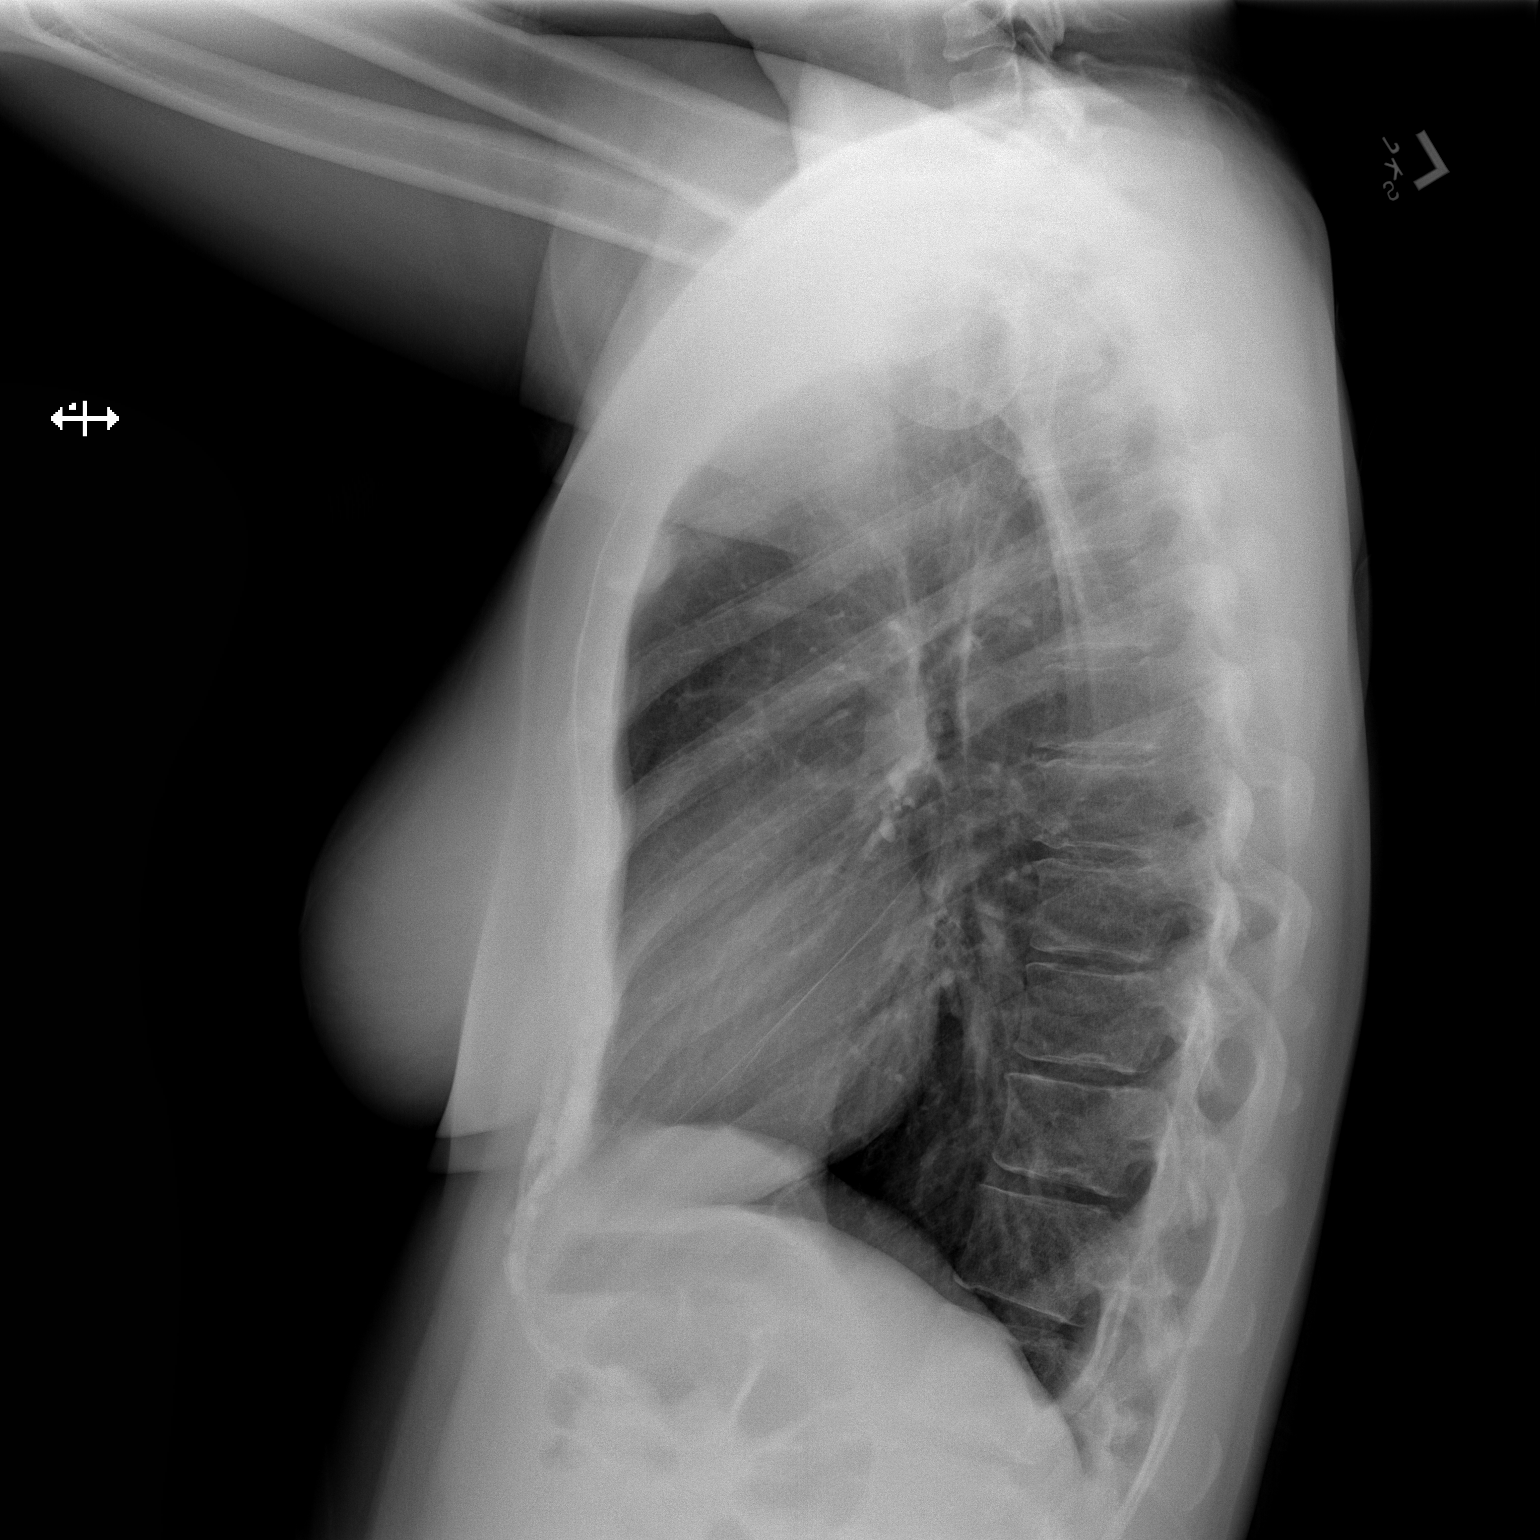

[2 of 2 positions shown; findings below may reference images not displayed]

FINDINGS: The heart size and mediastinal contours are within normal limits.
Both lungs are clear. The visualized skeletal structures are
unremarkable. No evidence of pleural plaquing is seen.
IMPRESSION: No active cardiopulmonary disease.

## 2024-03-01 DIAGNOSIS — F411 Generalized anxiety disorder: Secondary | ICD-10-CM | POA: Diagnosis not present

## 2024-03-24 DIAGNOSIS — J029 Acute pharyngitis, unspecified: Secondary | ICD-10-CM | POA: Diagnosis not present

## 2024-05-28 DIAGNOSIS — Z630 Problems in relationship with spouse or partner: Secondary | ICD-10-CM | POA: Diagnosis not present

## 2024-05-28 DIAGNOSIS — Z563 Stressful work schedule: Secondary | ICD-10-CM | POA: Diagnosis not present

## 2024-05-28 DIAGNOSIS — F322 Major depressive disorder, single episode, severe without psychotic features: Secondary | ICD-10-CM | POA: Diagnosis not present

## 2024-05-28 DIAGNOSIS — F4010 Social phobia, unspecified: Secondary | ICD-10-CM | POA: Diagnosis not present

## 2024-06-04 DIAGNOSIS — Z1331 Encounter for screening for depression: Secondary | ICD-10-CM | POA: Diagnosis not present

## 2024-06-04 DIAGNOSIS — Z01419 Encounter for gynecological examination (general) (routine) without abnormal findings: Secondary | ICD-10-CM | POA: Diagnosis not present

## 2024-06-04 DIAGNOSIS — Z113 Encounter for screening for infections with a predominantly sexual mode of transmission: Secondary | ICD-10-CM | POA: Diagnosis not present

## 2024-06-20 ENCOUNTER — Other Ambulatory Visit: Payer: Self-pay | Admitting: Family Medicine

## 2024-06-20 DIAGNOSIS — Z1231 Encounter for screening mammogram for malignant neoplasm of breast: Secondary | ICD-10-CM

## 2024-07-02 ENCOUNTER — Ambulatory Visit
Admission: RE | Admit: 2024-07-02 | Discharge: 2024-07-02 | Disposition: A | Discharge status: Home / Self Care | Source: Ambulatory Visit

## 2024-07-02 DIAGNOSIS — Z1231 Encounter for screening mammogram for malignant neoplasm of breast: Secondary | ICD-10-CM

## 2024-07-04 DIAGNOSIS — Z30433 Encounter for removal and reinsertion of intrauterine contraceptive device: Secondary | ICD-10-CM | POA: Diagnosis not present

## 2024-07-17 ENCOUNTER — Other Ambulatory Visit: Payer: Self-pay | Admitting: Family Medicine

## 2024-07-17 DIAGNOSIS — R928 Other abnormal and inconclusive findings on diagnostic imaging of breast: Secondary | ICD-10-CM

## 2024-07-25 ENCOUNTER — Ambulatory Visit
Admission: RE | Admit: 2024-07-25 | Discharge: 2024-07-25 | Disposition: A | Discharge status: Home / Self Care | Source: Ambulatory Visit | Attending: Family Medicine | Admitting: Family Medicine

## 2024-07-25 DIAGNOSIS — R928 Other abnormal and inconclusive findings on diagnostic imaging of breast: Secondary | ICD-10-CM

## 2024-07-25 DIAGNOSIS — N6489 Other specified disorders of breast: Secondary | ICD-10-CM | POA: Diagnosis not present

## 2024-07-26 ENCOUNTER — Ambulatory Visit: Payer: Self-pay | Admitting: Family Medicine

## 2024-09-03 DIAGNOSIS — F411 Generalized anxiety disorder: Secondary | ICD-10-CM | POA: Diagnosis not present

## 2024-09-09 ENCOUNTER — Telehealth: Admitting: Family Medicine

## 2024-09-09 VITALS — Ht 68.0 in | Wt 170.0 lb

## 2024-09-09 DIAGNOSIS — J3089 Other allergic rhinitis: Secondary | ICD-10-CM | POA: Diagnosis not present

## 2024-09-09 MED ORDER — LEVOCETIRIZINE DIHYDROCHLORIDE 5 MG PO TABS: 5.0000 mg | ORAL_TABLET | Freq: Every evening | ORAL | 3 refills | Status: AC

## 2024-09-09 MED ORDER — MONTELUKAST SODIUM 10 MG PO TABS: 10.0000 mg | ORAL_TABLET | Freq: Every day | ORAL | 2 refills | Status: AC

## 2024-09-09 NOTE — Progress Notes (Signed)
 Ht 5' 8 (1.727 m)   Wt 170 lb (77.1 kg)   BMI 25.85 kg/m    Subjective:    Patient ID: Laura Russo, female    DOB: 24-Apr-1986, 38 y.o.   MRN: [id number]  HPI: Laura Russo is a 38 y.o. female  Chief Complaint  Patient presents with   Allergic Rhinitis     Referral to allergist  Onset last summer, early fall.  Concerns of allergy to dog Concerns of mold in the house  Itchy eyes and throat. Sneezing, Dizziness one time, Nasal congestion Worsen in the evening Zyrtec helps but not lasting all day   Allergies have not been doing well. This has been going on for about the last 3-4 months. She does have a new dog. She is noticing more mildew/dust in the house. Big issues are more itching eyes, throat, runny nose. Zyrtec is helping but it's not working all day. Had been taking claritin and it wasn't working as well. No fevers, no chills. Has been tired. Otherwise doing well.   Relevant past medical, surgical, family and social history reviewed and updated as indicated. Interim medical history since our last visit reviewed. Allergies and medications reviewed and updated.  Review of Systems  Constitutional:  Positive for fatigue. Negative for activity change, appetite change, chills, diaphoresis, fever and unexpected weight change.  HENT:  Positive for congestion, rhinorrhea and sore throat. Negative for dental problem, drooling, ear discharge, ear pain, facial swelling, hearing loss, mouth sores, nosebleeds, postnasal drip, sinus pressure, sinus pain, sneezing, tinnitus, trouble swallowing and voice change.   Eyes: Negative.   Respiratory: Negative.    Cardiovascular: Negative.   Psychiatric/Behavioral: Negative.      Per HPI unless specifically indicated above     Objective:    Ht 5' 8 (1.727 m)   Wt 170 lb (77.1 kg)   BMI 25.85 kg/m   Wt Readings from Last 3 Encounters:  09/09/24 170 lb (77.1 kg)  09/18/23 169 lb 12.8 oz (77 kg)  06/05/23 163 lb 9.6 oz  (74.2 kg)    Physical Exam Vitals and nursing note reviewed.  Constitutional:      General: She is not in acute distress.    Appearance: Normal appearance. She is not ill-appearing, toxic-appearing or diaphoretic.  HENT:     Head: Normocephalic and atraumatic.     Right Ear: External ear normal.     Left Ear: External ear normal.     Nose: Nose normal.     Mouth/Throat:     Mouth: Mucous membranes are moist.     Pharynx: Oropharynx is clear.  Eyes:     General: No scleral icterus.       Right eye: No discharge.        Left eye: No discharge.     Conjunctiva/sclera: Conjunctivae normal.     Pupils: Pupils are equal, round, and reactive to light.  Pulmonary:     Effort: Pulmonary effort is normal. No respiratory distress.     Comments: Speaking in full sentences Musculoskeletal:        General: Normal range of motion.     Cervical back: Normal range of motion.  Skin:    Coloration: Skin is not jaundiced or pale.     Findings: No bruising, erythema, lesion or rash.  Neurological:     Mental Status: She is alert and oriented to person, place, and time. Mental status is at baseline.  Psychiatric:  Mood and Affect: Mood normal.        Behavior: Behavior normal.        Thought Content: Thought content normal.        Judgment: Judgment normal.     Results for orders placed or performed in visit on 09/18/23  CBC with Differential/Platelet   Collection Time: 09/18/23 10:20 AM  Result Value Ref Range   WBC 6.2 3.4 - 10.8 x10E3/uL   RBC 4.81 3.77 - 5.28 x10E6/uL   Hemoglobin 14.5 11.1 - 15.9 g/dL   Hematocrit 55.9 65.9 - 46.6 %   MCV 92 79 - 97 fL   MCH 30.1 26.6 - 33.0 pg   MCHC 33.0 31.5 - 35.7 g/dL   RDW 87.6 88.2 - 84.5 %   Platelets 350 150 - 450 x10E3/uL   Neutrophils 66 Not Estab. %   Lymphs 22 Not Estab. %   Monocytes 9 Not Estab. %   Eos 2 Not Estab. %   Basos 1 Not Estab. %   Neutrophils Absolute 4.2 1.4 - 7.0 x10E3/uL   Lymphocytes Absolute 1.3 0.7 -  3.1 x10E3/uL   Monocytes Absolute 0.5 0.1 - 0.9 x10E3/uL   EOS (ABSOLUTE) 0.2 0.0 - 0.4 x10E3/uL   Basophils Absolute 0.0 0.0 - 0.2 x10E3/uL   Immature Granulocytes 0 Not Estab. %   Immature Grans (Abs) 0.0 0.0 - 0.1 x10E3/uL  Comprehensive metabolic panel   Collection Time: 09/18/23 10:20 AM  Result Value Ref Range   Glucose 74 70 - 99 mg/dL   BUN 13 6 - 20 mg/dL   Creatinine, Ser 9.12 0.57 - 1.00 mg/dL   eGFR 87 >40 fO/fpw/8.26   BUN/Creatinine Ratio 15 9 - 23   Sodium 140 134 - 144 mmol/L   Potassium 4.6 3.5 - 5.2 mmol/L   Chloride 103 96 - 106 mmol/L   CO2 22 20 - 29 mmol/L   Calcium 9.8 8.7 - 10.2 mg/dL   Total Protein 6.9 6.0 - 8.5 g/dL   Albumin 4.4 3.9 - 4.9 g/dL   Globulin, Total 2.5 1.5 - 4.5 g/dL   Bilirubin Total 0.3 0.0 - 1.2 mg/dL   Alkaline Phosphatase 74 44 - 121 IU/L   AST 23 0 - 40 IU/L   ALT 21 0 - 32 IU/L  Lipid Panel w/o Chol/HDL Ratio   Collection Time: 09/18/23 10:20 AM  Result Value Ref Range   Cholesterol, Total 214 (H) 100 - 199 mg/dL   Triglycerides 887 0 - 149 mg/dL   HDL 74 >60 mg/dL   VLDL Cholesterol Cal 20 5 - 40 mg/dL   LDL Chol Calc (NIH) 879 (H) 0 - 99 mg/dL  TSH   Collection Time: 09/18/23 10:20 AM  Result Value Ref Range   TSH 1.730 0.450 - 4.500 uIU/mL      Assessment & Plan:   Problem List Items Addressed This Visit   None Visit Diagnoses       Non-seasonal allergic rhinitis, unspecified trigger    -  Primary   Will change from zyrtec to xyzal for fatigue. Referral to allergy placed. PRN singulair. Recheck in 2 weeks at physical   Relevant Orders   Ambulatory referral to Allergy        Follow up plan: Return for As scheduled.   This visit was completed via video visit through MyChart due to the restrictions of the COVID-19 pandemic. All issues as above were discussed and addressed. Physical exam was done as above through visual confirmation on  video through MyChart. If it was felt that the patient should be evaluated in  the office, they were directed there. The patient verbally consented to this visit. Location of the patient: home Location of the provider: work Those involved with this call:  Provider: Duwaine Louder, DO CMA: Cena Maffucci, CMA Front Desk/Registration: Claretta Maiden  Time spent on call: 15 minutes with patient face to face via video conference. More than 50% of this time was spent in counseling and coordination of care. 23 minutes total spent in review of patient's record and preparation of their chart.

## 2024-09-17 DIAGNOSIS — F331 Major depressive disorder, recurrent, moderate: Secondary | ICD-10-CM | POA: Diagnosis not present

## 2024-09-18 ENCOUNTER — Ambulatory Visit: Payer: Self-pay | Admitting: Family Medicine

## 2024-09-18 ENCOUNTER — Encounter: Payer: Self-pay | Admitting: Family Medicine

## 2024-09-18 VITALS — BP 116/80 | HR 81 | Temp 98.0°F | Ht 68.0 in | Wt 178.8 lb

## 2024-09-18 DIAGNOSIS — Z20828 Contact with and (suspected) exposure to other viral communicable diseases: Secondary | ICD-10-CM | POA: Insufficient documentation

## 2024-09-18 DIAGNOSIS — Z0000 Encounter for general adult medical examination without abnormal findings: Secondary | ICD-10-CM

## 2024-09-18 DIAGNOSIS — J45909 Unspecified asthma, uncomplicated: Secondary | ICD-10-CM | POA: Insufficient documentation

## 2024-09-18 DIAGNOSIS — F329 Major depressive disorder, single episode, unspecified: Secondary | ICD-10-CM | POA: Insufficient documentation

## 2024-09-18 DIAGNOSIS — E569 Vitamin deficiency, unspecified: Secondary | ICD-10-CM | POA: Insufficient documentation

## 2024-09-18 DIAGNOSIS — M26629 Arthralgia of temporomandibular joint, unspecified side: Secondary | ICD-10-CM | POA: Insufficient documentation

## 2024-09-18 DIAGNOSIS — J45990 Exercise induced bronchospasm: Secondary | ICD-10-CM | POA: Insufficient documentation

## 2024-09-18 DIAGNOSIS — L259 Unspecified contact dermatitis, unspecified cause: Secondary | ICD-10-CM | POA: Insufficient documentation

## 2024-09-18 DIAGNOSIS — L237 Allergic contact dermatitis due to plants, except food: Secondary | ICD-10-CM | POA: Insufficient documentation

## 2024-09-18 DIAGNOSIS — R5382 Chronic fatigue, unspecified: Secondary | ICD-10-CM

## 2024-09-18 DIAGNOSIS — J309 Allergic rhinitis, unspecified: Secondary | ICD-10-CM | POA: Insufficient documentation

## 2024-09-18 DIAGNOSIS — L03019 Cellulitis of unspecified finger: Secondary | ICD-10-CM | POA: Insufficient documentation

## 2024-09-18 NOTE — Progress Notes (Signed)
 BP 116/80   Pulse 81   Temp 98 F (36.7 C) (Oral)   Ht 5' 8 (1.727 m)   Wt 178 lb 12.8 oz (81.1 kg)   SpO2 98%   BMI 27.19 kg/m    Subjective:    Patient ID: Laura Russo, female    DOB: Jul 30, 1986, 38 y.o.   MRN: [id number]  HPI: Laura Russo is a 38 y.o. female presenting on 09/18/2024 for comprehensive medical examination. Current medical complaints include: Has been really tired- working with psychiatry to help. About to start wellbutrin.   Menopausal Symptoms: no  Depression Screen done today and results listed below:     09/18/2024    9:07 AM 09/18/2023   10:05 AM 06/05/2023    8:44 AM 09/16/2022    8:18 AM 08/11/2022    2:53 PM  Depression screen PHQ 2/9  Decreased Interest 1 0 1 1 2   Down, Depressed, Hopeless 1 0 1 1 1   PHQ - 2 Score 2 0 2 2 3   Altered sleeping 2 1 1  0 3  Tired, decreased energy 2 2 1 1 3   Change in appetite 1 0 1 0 1  Feeling bad or failure about yourself  1 0 0 0 0  Trouble concentrating 2 1 1 1 1   Moving slowly or fidgety/restless 0 0 0 0 0  Suicidal thoughts 0 0 0 0 0  PHQ-9 Score 10 4  6  4  11    Difficult doing work/chores Somewhat difficult Somewhat difficult Somewhat difficult Somewhat difficult Somewhat difficult     Data saved with a previous flowsheet row definition     Past Medical History:  Past Medical History:  Diagnosis Date   Allergy    seasonal   Asthma high school   mild, exercise-induced, no symptoms for years   Depression    being treated    Surgical History:  Past Surgical History:  Procedure Laterality Date   WISDOM TOOTH EXTRACTION      Medications:  Current Outpatient Medications on File Prior to Visit  Medication Sig   buPROPion ER (WELLBUTRIN SR) 100 MG 12 hr tablet Take 100 mg by mouth every morning.   fluticasone (FLONASE) 50 MCG/ACT nasal spray    ibuprofen (ADVIL) 200 MG tablet Take 200 mg by mouth every 6 (six) hours as needed.   levocetirizine (XYZAL ) 5 MG tablet Take 1 tablet  (5 mg total) by mouth every evening.   levonorgestrel (MIRENA, 52 MG,) 20 MCG/DAY IUD Take 1 device by intrauterine route.   LO LOESTRIN FE 1 MG-10 MCG / 10 MCG tablet Take 1 tablet by mouth daily.   montelukast  (SINGULAIR ) 10 MG tablet Take 1 tablet (10 mg total) by mouth at bedtime.   sertraline (ZOLOFT) 100 MG tablet TAKE 1 & 1/2 TABLET BY MOUTH EVERY MORNING AS DIRECTED   triamcinolone cream (KENALOG) 0.1 % Apply 1 Application topically as needed.   No current facility-administered medications on file prior to visit.    Allergies:  Allergies  Allergen Reactions   Erythromycin Base Rash    Social History:  Social History   Socioeconomic History   Marital status: Significant Other    Spouse name: Not on file   Number of children: Not on file   Years of education: Not on file   Highest education level: Bachelor's degree (e.g., BA, AB, BS)  Occupational History   Not on file  Tobacco Use   Smoking status: Never   Smokeless tobacco: Never  Vaping Use   Vaping status: Never Used  Substance and Sexual Activity   Alcohol use: Yes    Alcohol/week: 1.0 standard drink of alcohol    Types: 1 Glasses of wine per week    Comment: but also some weeks it's zero   Drug use: Never   Sexual activity: Yes    Birth control/protection: I.U.D., Pill  Other Topics Concern   Not on file  Social History Narrative   Not on file   Social Drivers of Health   Financial Resource Strain: Low Risk  (09/09/2024)   Overall Financial Resource Strain (CARDIA)    Difficulty of Paying Living Expenses: Not hard at all  Food Insecurity: No Food Insecurity (09/09/2024)   Hunger Vital Sign    Worried About Running Out of Food in the Last Year: Never true    Ran Out of Food in the Last Year: Never true  Transportation Needs: No Transportation Needs (09/09/2024)   PRAPARE - Administrator, Civil Service (Medical): No    Lack of Transportation (Non-Medical): No  Physical Activity:  Insufficiently Active (09/09/2024)   Exercise Vital Sign    Days of Exercise per Week: 4 days    Minutes of Exercise per Session: 30 min  Stress: Stress Concern Present (09/09/2024)   [employer] of Occupational Health - Occupational Stress Questionnaire    Feeling of Stress: To some extent  Social Connections: Socially Isolated (09/09/2024)   Social Connection and Isolation Panel    Frequency of Communication with Friends and Family: Once a week    Frequency of Social Gatherings with Friends and Family: Once a week    Attends Religious Services: Never    Database Administrator or Organizations: Yes    Attends Engineer, Structural: More than 4 times per year    Marital Status: Never married  Catering Manager Violence: Not on file   Social History   Tobacco Use  Smoking Status Never  Smokeless Tobacco Never   Social History   Substance and Sexual Activity  Alcohol Use Yes   Alcohol/week: 1.0 standard drink of alcohol   Types: 1 Glasses of wine per week   Comment: but also some weeks it's zero    Family History:  Family History  Problem Relation Age of Onset   Depression Mother    Asthma Mother    Hearing loss Mother    Heart disease Father    Diverticulosis Father    Prostate cancer Father    Cancer Father    Hearing loss Father    Asthma Sister    Heart disease Maternal Grandmother    Dementia Maternal Grandmother    Dementia Maternal Grandfather    Colon cancer Paternal Grandmother    Cancer Paternal Grandmother    Heart disease Paternal Uncle    Depression Maternal Aunt     Past medical history, surgical history, medications, allergies, family history and social history reviewed with patient today and changes made to appropriate areas of the chart.   Review of Systems  Constitutional:  Positive for diaphoresis (since starting the zoloft) and malaise/fatigue. Negative for chills, fever and weight loss.  HENT:  Positive for congestion. Negative for  ear discharge, ear pain, hearing loss, nosebleeds, sinus pain, sore throat and tinnitus.   Eyes: Negative.   Respiratory: Negative.  Negative for stridor.   Cardiovascular: Negative.   Gastrointestinal: Negative.        + gassy, occasionally gassy and crampy  Genitourinary:  Negative.   Musculoskeletal:  Positive for joint pain and neck pain. Negative for back pain, falls and myalgias.  Skin: Negative.   Neurological:  Positive for dizziness. Negative for tingling, tremors, sensory change, speech change, focal weakness, seizures, loss of consciousness, weakness and headaches.  Endo/Heme/Allergies:  Positive for environmental allergies. Negative for polydipsia. Does not bruise/bleed easily.  Psychiatric/Behavioral:  Positive for depression. Negative for hallucinations, memory loss, substance abuse and suicidal ideas. The patient is not nervous/anxious and does not have insomnia.    All other ROS negative except what is listed above and in the HPI.      Objective:    BP 116/80   Pulse 81   Temp 98 F (36.7 C) (Oral)   Ht 5' 8 (1.727 m)   Wt 178 lb 12.8 oz (81.1 kg)   SpO2 98%   BMI 27.19 kg/m   Wt Readings from Last 3 Encounters:  09/18/24 178 lb 12.8 oz (81.1 kg)  09/09/24 170 lb (77.1 kg)  09/18/23 169 lb 12.8 oz (77 kg)    Physical Exam Vitals and nursing note reviewed.  Constitutional:      General: She is not in acute distress.    Appearance: Normal appearance. She is not ill-appearing, toxic-appearing or diaphoretic.  HENT:     Head: Normocephalic and atraumatic.     Right Ear: Tympanic membrane, ear canal and external ear normal. There is no impacted cerumen.     Left Ear: Tympanic membrane, ear canal and external ear normal. There is no impacted cerumen.     Nose: Nose normal. No congestion or rhinorrhea.     Mouth/Throat:     Mouth: Mucous membranes are moist.     Pharynx: Oropharynx is clear. No oropharyngeal exudate or posterior oropharyngeal erythema.  Eyes:      General: No scleral icterus.       Right eye: No discharge.        Left eye: No discharge.     Extraocular Movements: Extraocular movements intact.     Conjunctiva/sclera: Conjunctivae normal.     Pupils: Pupils are equal, round, and reactive to light.  Neck:     Vascular: No carotid bruit.  Cardiovascular:     Rate and Rhythm: Normal rate and regular rhythm.     Pulses: Normal pulses.     Heart sounds: No murmur heard.    No friction rub. No gallop.  Pulmonary:     Effort: Pulmonary effort is normal. No respiratory distress.     Breath sounds: Normal breath sounds. No stridor. No wheezing, rhonchi or rales.  Chest:     Chest wall: No tenderness.  Abdominal:     General: Abdomen is flat. Bowel sounds are normal. There is no distension.     Palpations: Abdomen is soft. There is no mass.     Tenderness: There is no abdominal tenderness. There is no right CVA tenderness, left CVA tenderness, guarding or rebound.     Hernia: No hernia is present.  Genitourinary:    Comments: Breast and pelvic exams deferred with shared decision making Musculoskeletal:        General: No swelling, tenderness, deformity or signs of injury.     Cervical back: Normal range of motion and neck supple. No rigidity. No muscular tenderness.     Right lower leg: No edema.     Left lower leg: No edema.  Lymphadenopathy:     Cervical: No cervical adenopathy.  Skin:    General: Skin  is warm and dry.     Capillary Refill: Capillary refill takes less than 2 seconds.     Coloration: Skin is not jaundiced or pale.     Findings: No bruising, erythema, lesion or rash.  Neurological:     General: No focal deficit present.     Mental Status: She is alert and oriented to person, place, and time. Mental status is at baseline.     Cranial Nerves: No cranial nerve deficit.     Sensory: No sensory deficit.     Motor: No weakness.     Coordination: Coordination normal.     Gait: Gait normal.     Deep Tendon Reflexes:  Reflexes normal.  Psychiatric:        Mood and Affect: Mood normal.        Behavior: Behavior normal.        Thought Content: Thought content normal.        Judgment: Judgment normal.     Results for orders placed or performed in visit on 09/18/23  CBC with Differential/Platelet   Collection Time: 09/18/23 10:20 AM  Result Value Ref Range   WBC 6.2 3.4 - 10.8 x10E3/uL   RBC 4.81 3.77 - 5.28 x10E6/uL   Hemoglobin 14.5 11.1 - 15.9 g/dL   Hematocrit 55.9 65.9 - 46.6 %   MCV 92 79 - 97 fL   MCH 30.1 26.6 - 33.0 pg   MCHC 33.0 31.5 - 35.7 g/dL   RDW 87.6 88.2 - 84.5 %   Platelets 350 150 - 450 x10E3/uL   Neutrophils 66 Not Estab. %   Lymphs 22 Not Estab. %   Monocytes 9 Not Estab. %   Eos 2 Not Estab. %   Basos 1 Not Estab. %   Neutrophils Absolute 4.2 1.4 - 7.0 x10E3/uL   Lymphocytes Absolute 1.3 0.7 - 3.1 x10E3/uL   Monocytes Absolute 0.5 0.1 - 0.9 x10E3/uL   EOS (ABSOLUTE) 0.2 0.0 - 0.4 x10E3/uL   Basophils Absolute 0.0 0.0 - 0.2 x10E3/uL   Immature Granulocytes 0 Not Estab. %   Immature Grans (Abs) 0.0 0.0 - 0.1 x10E3/uL  Comprehensive metabolic panel   Collection Time: 09/18/23 10:20 AM  Result Value Ref Range   Glucose 74 70 - 99 mg/dL   BUN 13 6 - 20 mg/dL   Creatinine, Ser 9.12 0.57 - 1.00 mg/dL   eGFR 87 >40 fO/fpw/8.26   BUN/Creatinine Ratio 15 9 - 23   Sodium 140 134 - 144 mmol/L   Potassium 4.6 3.5 - 5.2 mmol/L   Chloride 103 96 - 106 mmol/L   CO2 22 20 - 29 mmol/L   Calcium 9.8 8.7 - 10.2 mg/dL   Total Protein 6.9 6.0 - 8.5 g/dL   Albumin 4.4 3.9 - 4.9 g/dL   Globulin, Total 2.5 1.5 - 4.5 g/dL   Bilirubin Total 0.3 0.0 - 1.2 mg/dL   Alkaline Phosphatase 74 44 - 121 IU/L   AST 23 0 - 40 IU/L   ALT 21 0 - 32 IU/L  Lipid Panel w/o Chol/HDL Ratio   Collection Time: 09/18/23 10:20 AM  Result Value Ref Range   Cholesterol, Total 214 (H) 100 - 199 mg/dL   Triglycerides 887 0 - 149 mg/dL   HDL 74 >60 mg/dL   VLDL Cholesterol Cal 20 5 - 40 mg/dL   LDL Chol  Calc (NIH) 120 (H) 0 - 99 mg/dL  TSH   Collection Time: 09/18/23 10:20 AM  Result Value Ref Range  TSH 1.730 0.450 - 4.500 uIU/mL      Assessment & Plan:   Problem List Items Addressed This Visit   None Visit Diagnoses       Routine general medical examination at a health care facility    -  Primary   Vaccines up to date. Screening labs checked today. Pap and mammo up to date. Continue diet and exercise. Call with any concerns.   Relevant Orders   CBC with Differential/Platelet   Comprehensive metabolic panel with GFR   Lipid Panel w/o Chol/HDL Ratio   TSH   Hepatitis B surface antibody,quantitative     Chronic fatigue       Will check labs. + mild snoring- will call if not getting better and we'll consider sleep study.   Relevant Orders   VITAMIN D 25 Hydroxy (Vit-D Deficiency, Fractures)   B12   Ferritin   Iron Binding Cap (TIBC)([facility])        Follow up plan: Return in about 1 year (around 09/18/2025) for physical.   LABORATORY TESTING:  - Pap smear: done elsewhere  IMMUNIZATIONS:   - Tdap: Tetanus vaccination status reviewed: last tetanus booster within 10 years. - Influenza: Up to date - Prevnar: Will come back for - COVID: Up to date - HPV: Refused   SCREENING: -Mammogram: Ordered today   PATIENT COUNSELING:   Advised to take 1 mg of folate supplement per day if capable of pregnancy.   Sexuality: Discussed sexually transmitted diseases, partner selection, use of condoms, avoidance of unintended pregnancy  and contraceptive alternatives.   Advised to avoid cigarette smoking.  I discussed with the patient that most people either abstain from alcohol or drink within safe limits (<=14/week and <=4 drinks/occasion for males, <=7/weeks and <= 3 drinks/occasion for females) and that the risk for alcohol disorders and other health effects rises proportionally with the number of drinks per week and how often a drinker exceeds daily limits.  Discussed  cessation/primary prevention of drug use and availability of treatment for abuse.   Diet: Encouraged to adjust caloric intake to maintain  or achieve ideal body weight, to reduce intake of dietary saturated fat and total fat, to limit sodium intake by avoiding high sodium foods and not adding table salt, and to maintain adequate dietary potassium and calcium preferably from fresh fruits, vegetables, and low-fat dairy products.    stressed the importance of regular exercise  Injury prevention: Discussed safety belts, safety helmets, smoke detector, smoking near bedding or upholstery.   Dental health: Discussed importance of regular tooth brushing, flossing, and dental visits.    NEXT PREVENTATIVE PHYSICAL DUE IN 1 YEAR. Return in about 1 year (around 09/18/2025) for physical.

## 2024-09-18 NOTE — Patient Instructions (Signed)
 Allergist:  Signe Pottier [address] [place] [address] [phone]

## 2024-09-19 ENCOUNTER — Ambulatory Visit: Payer: Self-pay | Admitting: Family Medicine

## 2024-09-19 LAB — CBC WITH DIFFERENTIAL/PLATELET
Basophils Absolute: 0 x10E3/uL (ref 0.0–0.2)
Basos: 1 %
EOS (ABSOLUTE): 0.2 x10E3/uL (ref 0.0–0.4)
Eos: 3 %
Hematocrit: 46.8 % — ABNORMAL HIGH (ref 34.0–46.6)
Hemoglobin: 15.1 g/dL (ref 11.1–15.9)
Immature Grans (Abs): 0 x10E3/uL (ref 0.0–0.1)
Immature Granulocytes: 0 %
Lymphocytes Absolute: 1.3 x10E3/uL (ref 0.7–3.1)
Lymphs: 19 %
MCH: 30.1 pg (ref 26.6–33.0)
MCHC: 32.3 g/dL (ref 31.5–35.7)
MCV: 93 fL (ref 79–97)
Monocytes Absolute: 0.4 x10E3/uL (ref 0.1–0.9)
Monocytes: 6 %
Neutrophils Absolute: 4.7 x10E3/uL (ref 1.4–7.0)
Neutrophils: 71 %
Platelets: 338 x10E3/uL (ref 150–450)
RBC: 5.02 x10E6/uL (ref 3.77–5.28)
RDW: 12.3 % (ref 11.7–15.4)
WBC: 6.6 x10E3/uL (ref 3.4–10.8)

## 2024-09-19 LAB — COMPREHENSIVE METABOLIC PANEL WITH GFR
ALT: 23 IU/L (ref 0–32)
AST: 21 IU/L (ref 0–40)
Albumin: 4.4 g/dL (ref 3.9–4.9)
Alkaline Phosphatase: 62 IU/L (ref 41–116)
BUN/Creatinine Ratio: 16 (ref 9–23)
BUN: 14 mg/dL (ref 6–24)
Bilirubin Total: 0.2 mg/dL (ref 0.0–1.2)
CO2: 23 mmol/L (ref 20–29)
Calcium: 9.6 mg/dL (ref 8.7–10.2)
Chloride: 102 mmol/L (ref 96–106)
Creatinine, Ser: 0.9 mg/dL (ref 0.57–1.00)
Globulin, Total: 2.5 g/dL (ref 1.5–4.5)
Glucose: 76 mg/dL (ref 70–99)
Potassium: 4.5 mmol/L (ref 3.5–5.2)
Sodium: 139 mmol/L (ref 134–144)
Total Protein: 6.9 g/dL (ref 6.0–8.5)
eGFR: 83 mL/min/1.73 (ref >59)

## 2024-09-19 LAB — TSH: TSH: 1.61 u[IU]/mL (ref 0.450–4.500)

## 2024-09-19 LAB — VITAMIN D 25 HYDROXY (VIT D DEFICIENCY, FRACTURES): Vit D, 25-Hydroxy: 33.7 ng/mL (ref 30.0–100.0)

## 2024-09-19 LAB — LIPID PANEL W/O CHOL/HDL RATIO
Cholesterol, Total: 213 mg/dL — ABNORMAL HIGH (ref 100–199)
HDL: 67 mg/dL (ref >39)
LDL Chol Calc (NIH): 126 mg/dL — ABNORMAL HIGH (ref 0–99)
Triglycerides: 113 mg/dL (ref 0–149)
VLDL Cholesterol Cal: 20 mg/dL (ref 5–40)

## 2024-09-19 LAB — VITAMIN B12: Vitamin B-12: 303 pg/mL (ref 232–1245)

## 2024-09-19 LAB — FERRITIN: Ferritin: 45 ng/mL (ref 15–150)

## 2024-09-19 LAB — IRON AND TIBC
Iron Saturation: 38 % (ref 15–55)
Iron: 119 ug/dL (ref 27–159)
Total Iron Binding Capacity: 316 ug/dL (ref 250–450)
UIBC: 197 ug/dL (ref 131–425)

## 2024-09-19 LAB — HEPATITIS B SURFACE ANTIBODY, QUANTITATIVE: Hepatitis B Surf Ab Quant: 38.5 m[IU]/mL

## 2024-09-26 DIAGNOSIS — L853 Xerosis cutis: Secondary | ICD-10-CM | POA: Diagnosis not present

## 2024-09-26 DIAGNOSIS — L538 Other specified erythematous conditions: Secondary | ICD-10-CM | POA: Diagnosis not present

## 2024-09-26 DIAGNOSIS — L570 Actinic keratosis: Secondary | ICD-10-CM | POA: Diagnosis not present

## 2024-09-26 DIAGNOSIS — L814 Other melanin hyperpigmentation: Secondary | ICD-10-CM | POA: Diagnosis not present

## 2024-09-26 DIAGNOSIS — L820 Inflamed seborrheic keratosis: Secondary | ICD-10-CM | POA: Diagnosis not present

## 2024-09-27 ENCOUNTER — Ambulatory Visit: Admitting: Family Medicine

## 2025-09-22 ENCOUNTER — Encounter: Admitting: Family Medicine
